# Patient Record
Sex: Female | Born: 1960 | Race: White | Hispanic: No | Marital: Married | Smoking: Former smoker
Health system: Southern US, Community
[De-identification: ages and names within clinical notes are randomized; demographics above are authoritative.]

## PROBLEM LIST (undated history)

## (undated) DIAGNOSIS — I10 Essential (primary) hypertension: Secondary | ICD-10-CM

## (undated) DIAGNOSIS — K219 Gastro-esophageal reflux disease without esophagitis: Secondary | ICD-10-CM

## (undated) DIAGNOSIS — G43909 Migraine, unspecified, not intractable, without status migrainosus: Secondary | ICD-10-CM

## (undated) HISTORY — PX: BREAST ENHANCEMENT SURGERY: SHX7

## (undated) HISTORY — PX: VAGINAL HYSTERECTOMY: SUR661

## (undated) HISTORY — PX: TONSILLECTOMY: SUR1361

## (undated) HISTORY — PX: ABDOMINAL HYSTERECTOMY: SHX81

## (undated) HISTORY — DX: Gastro-esophageal reflux disease without esophagitis: K21.9

## (undated) HISTORY — PX: ABDOMINAL SURGERY: SHX537

---

## 2019-02-10 ENCOUNTER — Other Ambulatory Visit: Payer: Self-pay

## 2019-02-10 ENCOUNTER — Encounter: Payer: Self-pay | Admitting: Family Medicine

## 2019-02-10 ENCOUNTER — Emergency Department
Admission: EM | Admit: 2019-02-10 | Discharge: 2019-02-10 | Disposition: A | Discharge status: Home / Self Care | Payer: Self-pay | Source: Home / Self Care

## 2019-02-10 DIAGNOSIS — R519 Headache, unspecified: Secondary | ICD-10-CM | POA: Diagnosis not present

## 2019-02-10 DIAGNOSIS — J0100 Acute maxillary sinusitis, unspecified: Secondary | ICD-10-CM | POA: Diagnosis not present

## 2019-02-10 HISTORY — DX: Essential (primary) hypertension: I10

## 2019-02-10 HISTORY — DX: Migraine, unspecified, not intractable, without status migrainosus: G43.909

## 2019-02-10 MED ORDER — PREDNISONE 50 MG PO TABS: ORAL_TABLET | ORAL | 0 refills | Status: DC

## 2019-02-10 MED ORDER — AMOXICILLIN 875 MG PO TABS: 875.0000 mg | ORAL_TABLET | Freq: Two times a day (BID) | ORAL | 0 refills | Status: DC

## 2019-02-10 NOTE — ED Provider Notes (Signed)
[name]    CSN: [id number] Arrival date & time: 02/10/19  1118      History   Chief Complaint Chief Complaint  Patient presents with  . Headache    HPI Nancy Bautista is a 58 y.o. female.   Initial [facility] visit for this 58 yo woman complaining of headache for a week.  Nothing is helping.  Has a hx of migraines.  Also having nasal drainage     Past Medical History:  Diagnosis Date  . Hypertension   . Migraines     There are no problems to display for this patient.     OB History   No obstetric history on file.      Home Medications    Prior to Admission medications   Medication Sig Start Date End Date Taking? Authorizing Provider  amoxicillin (AMOXIL) 875 MG tablet Take 1 tablet (875 mg total) by mouth 2 (two) times daily. 02/10/19   Robyn Haber, MD  baclofen (LIORESAL) 10 MG tablet PLEASE SEE ATTACHED FOR DETAILED DIRECTIONS 10/29/18   [provider]  metoprolol tartrate (LOPRESSOR) 25 MG tablet Take 12.5 mg by mouth 2 (two) times daily as needed. 09/01/18   [provider]  omeprazole (PRILOSEC) 40 MG capsule Take 40 mg by mouth daily. 12/01/18   [provider]  predniSONE (DELTASONE) 50 MG tablet One daily with food 02/10/19   Robyn Haber, MD    Family History Family History  Problem Relation Age of Onset  . Cancer Mother   . Heart failure Father     Social History Social History   Tobacco Use  . Smoking status: Never Smoker  . Smokeless tobacco: Never Used  Substance Use Topics  . Alcohol use: Not on file    Comment: socially  . Drug use: Not Currently     Allergies   Cardizem [diltiazem] and Ivp dye [iodinated diagnostic agents]   Review of Systems Review of Systems  HENT: Positive for congestion.   Neurological: Positive for headaches.  All other systems reviewed and are negative.    Physical Exam Triage Vital Signs ED Triage Vitals  Enc Vitals Group     BP      Pulse        Resp      Temp      Temp src      SpO2      Weight      Height      Head Circumference      Peak Flow      Pain Score      Pain Loc      Pain Edu?      Excl. in [place]?    No data found.  Updated Vital Signs BP (!) 166/95 (BP Location: Right Arm)   Pulse (!) 51   Temp 98.5 F (36.9 C) (Oral)   Resp 20   Ht 5\' 7"  (1.702 m)   Wt 73 kg   SpO2 98%   BMI 25.22 kg/m    Physical Exam Vitals reviewed.  Constitutional:      Appearance: She is well-developed.  HENT:     Head: Normocephalic.  Eyes:     Extraocular Movements: Extraocular movements intact.     Comments: Nasal swelling  Distorted left TM  Cardiovascular:     Rate and Rhythm: Bradycardia present.  Pulmonary:     Effort: Pulmonary effort is normal.  Musculoskeletal:        General: Normal range  of motion.     Cervical back: Normal range of motion and neck supple.  Skin:    General: Skin is warm and dry.  Neurological:     Mental Status: She is alert.  Psychiatric:        Mood and Affect: Mood normal.      UC Treatments / Results  Labs (all labs ordered are listed, but only abnormal results are displayed) Labs Reviewed  NOVEL CORONAVIRUS, NAA    EKG   Radiology No results found.  Procedures Procedures (including critical care time)  Medications Ordered in UC Medications - No data to display  Initial Impression / Assessment and Plan / UC Course  I have reviewed the triage vital signs and the nursing notes.  Pertinent labs & imaging results that were available during my care of the patient were reviewed by me and considered in my medical decision making (see chart for details).    Final Clinical Impressions(s) / UC Diagnoses   Final diagnoses:  Nonintractable headache, unspecified chronicity pattern, unspecified headache type  Acute non-recurrent maxillary sinusitis     Discharge Instructions     Your blood pressure is mildly elevated today and should be rechecked within the  month by your personal care provider.  Covid prevention measures:  Vitamin D3 5000 IU (125 mg) daily Vitamin C 500 mg twice daily Zinc 50 to 75 mg daily  Listerine type mouthwash 4 times a day       ED Prescriptions    Medication Sig Dispense Auth. Provider   amoxicillin (AMOXIL) 875 MG tablet Take 1 tablet (875 mg total) by mouth 2 (two) times daily. 20 tablet Elvina Sidle, MD   predniSONE (DELTASONE) 50 MG tablet One daily with food 2 tablet Elvina Sidle, MD     I have reviewed the PDMP during this encounter.   Elvina Sidle, MD 02/10/19 1201

## 2019-02-10 NOTE — Discharge Instructions (Addendum)
Your blood pressure is mildly elevated today and should be rechecked within the month by your personal care provider.  Covid prevention measures:  Vitamin D3 5000 IU (125 mg) daily Vitamin C 500 mg twice daily Zinc 50 to 75 mg daily  Listerine type mouthwash 4 times a day

## 2019-02-10 NOTE — ED Triage Notes (Signed)
Headache for a week.  Nothing is helping.  Has a hx of migraines.  Also having nasal drainage

## 2019-02-12 LAB — NOVEL CORONAVIRUS, NAA: SARS-CoV-2, NAA: NOT DETECTED

## 2019-05-11 ENCOUNTER — Other Ambulatory Visit: Payer: Self-pay

## 2019-05-11 ENCOUNTER — Encounter: Payer: Self-pay | Admitting: Medical-Surgical

## 2019-05-11 ENCOUNTER — Ambulatory Visit (INDEPENDENT_AMBULATORY_CARE_PROVIDER_SITE_OTHER): Payer: 59 | Admitting: Medical-Surgical

## 2019-05-11 VITALS — BP 150/90 | HR 62 | Temp 98.1°F | Ht 65.75 in | Wt 165.1 lb

## 2019-05-11 DIAGNOSIS — R011 Cardiac murmur, unspecified: Secondary | ICD-10-CM

## 2019-05-11 DIAGNOSIS — Z0000 Encounter for general adult medical examination without abnormal findings: Secondary | ICD-10-CM

## 2019-05-11 DIAGNOSIS — I10 Essential (primary) hypertension: Secondary | ICD-10-CM

## 2019-05-11 DIAGNOSIS — R002 Palpitations: Secondary | ICD-10-CM | POA: Diagnosis not present

## 2019-05-11 DIAGNOSIS — R32 Unspecified urinary incontinence: Secondary | ICD-10-CM

## 2019-05-11 DIAGNOSIS — R635 Abnormal weight gain: Secondary | ICD-10-CM

## 2019-05-11 MED ORDER — LOSARTAN POTASSIUM 25 MG PO TABS: 25.0000 mg | ORAL_TABLET | Freq: Every day | ORAL | 0 refills | Status: DC

## 2019-05-11 NOTE — Progress Notes (Signed)
.New Patient Office Visit  Subjective:  Patient ID: Nancy Bautista, female    DOB: 12/13/1960  Age: 59 y.o. MRN: [id number]  CC:  Chief Complaint  Patient presents with  . Establish Care  . Hypertension  . Weight Gain    HPI Nancy Bautista presents to establish care.  Palpitations-taking metoprolol 12.5 mg twice daily.  Feels this controls palpitations well with the occasional breakthrough episode.  Hypertension-Taking blood pressures at home with readings averaging 140s/80s-90s.  Adds minimal salt to foods, exercises regularly.  Denies history of snoring or sleep apnea.  4 to 5 glasses of wine per week.  Occasional sharp stabbing chest pains, attributed to costochondritis, relieved with lying on the flat surface and popping her back.  Denies shortness of breath, nausea, dizziness, and lower extremity swelling.  Weight gain-had a tummy tuck and breast lift done last year.  Traditionally very active and has resumed previous activity level.  Has a Systems analyst and exercises regularly.  Avoids processed foods, sugars, simple carbohydrates, sodas.  Drinks water most of the day.  Does drink 2 cups of keto coffee every morning.  Has gained approximately 15 pounds since her surgery despite all of this.  Reports previously trying intermittent fasting which did not help with weight loss.  Migraines with aura-reports having frequent headaches of all different types including sinus, tension, and ice pick.  Migraines occur approximately 3-4 times per year with visual aura and accompanied by photophobia/phonophobia.  Uses Tylenol severe sinus for regular headaches and takes baclofen for migraines  Has tried Topamax for prevention in the past but this had cognitive effects and she could not tolerate it.  Managed by neurology.  Urinary incontinence-evaluated by urology.  Mix of stress incontinence and urge incontinence.  Was told options for treatment include Kegels, pessary, or surgery.  Past  Medical History:  Diagnosis Date  . GERD (gastroesophageal reflux disease)   . Hypertension   . Migraines     Past Surgical History:  Procedure Laterality Date  . ABDOMINAL HYSTERECTOMY    . ABDOMINAL SURGERY    . BREAST ENHANCEMENT SURGERY      Family History  Problem Relation Age of Onset  . Cancer Mother   . Hypertension Mother   . Heart attack Mother   . Breast cancer Mother   . Ovarian cancer Mother   . Dementia Mother   . Stroke Mother   . Aneurysm Mother   . Heart failure Father   . Hypertension Father   . Heart attack Father   . Breast cancer Maternal Aunt   . Breast cancer Maternal Grandmother   . Ovarian cancer Maternal Grandmother     Social History   Socioeconomic History  . Marital status: Single    Spouse name: Not on file  . Number of children: Not on file  . Years of education: Not on file  . Highest education level: Not on file  Occupational History  . Not on file  Tobacco Use  . Smoking status: Former Games developer  . Smokeless tobacco: Never Used  Substance and Sexual Activity  . Alcohol use: Yes    Alcohol/week: 3.0 standard drinks    Types: 3 Glasses of wine per week    Comment: socially  . Drug use: Never  . Sexual activity: Yes    Birth control/protection: Surgical  Other Topics Concern  . Not on file  Social History Narrative  . Not on file   Social Determinants of Health   Financial Resource  Strain:   . Difficulty of Paying Living Expenses:   Food Insecurity:   . Worried About Programme researcher, broadcasting/film/video in the Last Year:   . Barista in the Last Year:   Transportation Needs:   . Freight forwarder (Medical):   Marland Kitchen Lack of Transportation (Non-Medical):   Physical Activity:   . Days of Exercise per Week:   . Minutes of Exercise per Session:   Stress:   . Feeling of Stress :   Social Connections:   . Frequency of Communication with Friends and Family:   . Frequency of Social Gatherings with Friends and Family:   . Attends  Religious Services:   . Active Member of Clubs or Organizations:   . Attends Banker Meetings:   Marland Kitchen Marital Status:   Intimate Partner Violence:   . Fear of Current or Ex-Partner:   . Emotionally Abused:   Marland Kitchen Physically Abused:   . Sexually Abused:     ROS Review of Systems  Respiratory: Negative for cough, chest tightness and shortness of breath.   Cardiovascular: Positive for chest pain (sharp chest pains, intermittent, resolved with laying on the ground and popping her back), palpitations (occasional) and leg swelling.  Gastrointestinal: Negative for abdominal pain, constipation, diarrhea and nausea.  Endocrine: Positive for cold intolerance and polydipsia.  Genitourinary: Positive for urgency.  Musculoskeletal: Positive for back pain and neck pain.  Skin: Negative for rash and wound.  Neurological: Positive for headaches. Negative for dizziness.  Psychiatric/Behavioral: Negative for self-injury, sleep disturbance and suicidal ideas.    Objective:   Today's Vitals: BP (!) 150/90   Pulse 62   Temp 98.1 F (36.7 C) (Oral)   Ht 5' 5.75" (1.67 m)   Wt 165 lb 1.6 oz (74.9 kg)   SpO2 95%   BMI 26.85 kg/m   Physical Exam Vitals reviewed.  Constitutional:      General: She is not in acute distress.    Appearance: Normal appearance.  HENT:     Head: Normocephalic and atraumatic.     Right Ear: Tympanic membrane normal.     Left Ear: Tympanic membrane normal.  Eyes:     Extraocular Movements: Extraocular movements intact.     Conjunctiva/sclera: Conjunctivae normal.     Pupils: Pupils are equal, round, and reactive to light.  Cardiovascular:     Rate and Rhythm: Normal rate and regular rhythm.     Pulses: Normal pulses.     Heart sounds: Murmur (Systolic, grade 3/6) present. No friction rub. No gallop.   Pulmonary:     Effort: Pulmonary effort is normal. No respiratory distress.     Breath sounds: Normal breath sounds. No wheezing.  Abdominal:     General:  Abdomen is flat. Bowel sounds are normal. There is no distension.     Palpations: Abdomen is soft. There is no mass.     Tenderness: There is no abdominal tenderness. There is no right CVA tenderness, left CVA tenderness, guarding or rebound.     Hernia: No hernia is present.  Musculoskeletal:     Cervical back: Normal range of motion and neck supple.  Skin:    General: Skin is warm and dry.  Neurological:     Mental Status: She is alert and oriented to person, place, and time.  Psychiatric:        Mood and Affect: Mood normal.        Behavior: Behavior normal.  Thought Content: Thought content normal.        Judgment: Judgment normal.     Assessment & Plan:   1. Essential hypertension Starting losartan 25 mg daily.  Continue checking blood pressures at home and avoiding processed/fast foods.  Limit sodium to less than 2 g/day.  Continue regular exercise. - losartan (COZAAR) 25 MG tablet; Take 1 tablet (25 mg total) by mouth daily.  Dispense: 90 tablet; Refill: 0  2. Preventative health care Checking CBC, CMP, and lipid panel today.  Up-to-date on preventative care. - Comprehensive metabolic panel - Lipid panel - CBC  3. Palpitations Continue metoprolol 12.5 mg twice daily. - Ambulatory referral to Cardiology  4. Heart murmur, systolic Patient reports previously being told that she had a heart murmur but that this resolved on its own.  Referring to cardiology to establish relationship here at patient's request for further evaluation of heart murmur. - Ambulatory referral to Cardiology  5.  Urinary incontinence Recommend continuing Kegels.  Managed by urology.  6.  Weight gain Discussed options available to help with weight loss.  With history of palpitations and hypertension, would like to avoid phentermine.  Previously did not tolerate Topamax so would also avoid that.  Interested in [place] or [place].  Will get better control of blood pressure and then discuss  starting weight loss medications.  Outpatient Encounter Medications as of 05/11/2019  Medication Sig  . baclofen (LIORESAL) 10 MG tablet PLEASE SEE ATTACHED FOR DETAILED DIRECTIONS  . metoprolol tartrate (LOPRESSOR) 25 MG tablet Take 12.5 mg by mouth 2 (two) times daily as needed.  Marland Kitchen omeprazole (PRILOSEC) 40 MG capsule Take 40 mg by mouth daily.  Marland Kitchen UNABLE TO FIND daily. Bio Identical Hormones  . VITAMIN D PO Take 2,000 Int'l Units by mouth daily.  Marland Kitchen losartan (COZAAR) 25 MG tablet Take 1 tablet (25 mg total) by mouth daily.  . [DISCONTINUED] amoxicillin (AMOXIL) 875 MG tablet Take 1 tablet (875 mg total) by mouth 2 (two) times daily. (Patient not taking: Reported on 05/11/2019)  . [DISCONTINUED] predniSONE (DELTASONE) 50 MG tablet One daily with food (Patient not taking: Reported on 05/11/2019)   No facility-administered encounter medications on file as of 05/11/2019.    Follow-up: Return in 4 weeks (on 06/08/2019) for HTN follow up.   Clearnce Sorrel, DNP, APRN, FNP-BC [facility]

## 2019-05-12 LAB — CBC
HCT: 41.6 % (ref 35.0–45.0)
Hemoglobin: 13.8 g/dL (ref 11.7–15.5)
MCH: 30.8 pg (ref 27.0–33.0)
MCHC: 33.2 g/dL (ref 32.0–36.0)
MCV: 92.9 fL (ref 80.0–100.0)
MPV: 10.5 fL (ref 7.5–12.5)
Platelets: 302 10*3/uL (ref 140–400)
RBC: 4.48 10*6/uL (ref 3.80–5.10)
RDW: 12.8 % (ref 11.0–15.0)
WBC: 7.7 10*3/uL (ref 3.8–10.8)

## 2019-05-12 LAB — COMPREHENSIVE METABOLIC PANEL
AG Ratio: 1.6 (calc) (ref 1.0–2.5)
ALT: 25 U/L (ref 6–29)
AST: 21 U/L (ref 10–35)
Albumin: 4.1 g/dL (ref 3.6–5.1)
Alkaline phosphatase (APISO): 40 U/L (ref 37–153)
BUN: 15 mg/dL (ref 7–25)
CO2: 26 mmol/L (ref 20–32)
Calcium: 9.4 mg/dL (ref 8.6–10.4)
Chloride: 105 mmol/L (ref 98–110)
Creat: 0.92 mg/dL (ref 0.50–1.05)
Globulin: 2.5 g/dL (calc) (ref 1.9–3.7)
Glucose, Bld: 102 mg/dL — ABNORMAL HIGH (ref 65–99)
Potassium: 4.3 mmol/L (ref 3.5–5.3)
Sodium: 140 mmol/L (ref 135–146)
Total Bilirubin: 0.3 mg/dL (ref 0.2–1.2)
Total Protein: 6.6 g/dL (ref 6.1–8.1)

## 2019-05-12 LAB — LIPID PANEL
Cholesterol: 195 mg/dL (ref <200)
HDL: 73 mg/dL (ref >=50)
LDL Cholesterol (Calc): 107 mg/dL (calc) — ABNORMAL HIGH
Non-HDL Cholesterol (Calc): 122 mg/dL (calc) (ref <130)
Total CHOL/HDL Ratio: 2.7 (calc) (ref <5.0)
Triglycerides: 60 mg/dL (ref <150)

## 2019-06-01 ENCOUNTER — Other Ambulatory Visit: Payer: Self-pay

## 2019-06-01 ENCOUNTER — Encounter: Payer: Self-pay | Admitting: Medical-Surgical

## 2019-06-01 ENCOUNTER — Ambulatory Visit (INDEPENDENT_AMBULATORY_CARE_PROVIDER_SITE_OTHER): Payer: 59 | Admitting: Medical-Surgical

## 2019-06-01 VITALS — BP 110/72 | HR 53 | Temp 97.9°F | Ht 65.75 in | Wt 165.0 lb

## 2019-06-01 DIAGNOSIS — R635 Abnormal weight gain: Secondary | ICD-10-CM

## 2019-06-01 DIAGNOSIS — I10 Essential (primary) hypertension: Secondary | ICD-10-CM

## 2019-06-01 DIAGNOSIS — F418 Other specified anxiety disorders: Secondary | ICD-10-CM

## 2019-06-01 MED ORDER — NALTREXONE-BUPROPION HCL ER 8-90 MG PO TB12: ORAL_TABLET | ORAL | 0 refills | Status: DC

## 2019-06-01 MED ORDER — BUPROPION HCL ER (XL) 150 MG PO TB24: 150.0000 mg | ORAL_TABLET | ORAL | 0 refills | Status: DC

## 2019-06-01 NOTE — Addendum Note (Signed)
Addended byChristen Butter on: 06/01/2019 02:26 PM   Modules accepted: Orders

## 2019-06-01 NOTE — Telephone Encounter (Signed)
Routing to provider  

## 2019-06-01 NOTE — Progress Notes (Addendum)
Subjective:    CC: HTN follow up  HPI: Pleasant 59 year old female presenting today for hypertension follow-up.  Started losartan 25 mg daily approximately 1 month ago.  Tolerating the medication well without side effects.  Checking blood pressures intermittently at home.  Denies chest pain, shortness of breath, lower extremity edema, headaches, dizziness.  Continues to have palpitations but these are no worse than usual and are managed with metoprolol 12.5 mg twice daily as needed.  Remains very concerned about her weight.  She is exercising regularly with a trainer, working with a nutritionist on her diet, and regularly tracking her progress.  Despite all this she has been unable to lose any weight at all.  Reports that her clothes are not fitting her any better after 2 months of strict diet and exercise.  Plans to get married in September and is very concerned about her weight.  Reports she started experiencing some depression and anxiety revolving around the weight issue.  Reports her goal weight is approximately 150 pounds.  Interested in trying medication to help with weight loss.  I reviewed the past medical history, family history, social history, surgical history, and allergies today and no changes were needed.  Please see the problem list section below in epic for further details.  Past Medical History: Past Medical History:  Diagnosis Date  . GERD (gastroesophageal reflux disease)   . Hypertension   . Migraines    Past Surgical History: Past Surgical History:  Procedure Laterality Date  . ABDOMINAL HYSTERECTOMY    . ABDOMINAL SURGERY    . BREAST ENHANCEMENT SURGERY     Social History: Social History   Socioeconomic History  . Marital status: Single    Spouse name: Not on file  . Number of children: Not on file  . Years of education: Not on file  . Highest education level: Not on file  Occupational History  . Not on file  Tobacco Use  . Smoking status: Former Research scientist (life sciences)   . Smokeless tobacco: Never Used  Substance and Sexual Activity  . Alcohol use: Yes    Alcohol/week: 3.0 standard drinks    Types: 3 Glasses of wine per week    Comment: socially  . Drug use: Never  . Sexual activity: Yes    Birth control/protection: Surgical  Other Topics Concern  . Not on file  Social History Narrative  . Not on file   Social Determinants of Health   Financial Resource Strain:   . Difficulty of Paying Living Expenses:   Food Insecurity:   . Worried About Charity fundraiser in the Last Year:   . Arboriculturist in the Last Year:   Transportation Needs:   . Film/video editor (Medical):   Marland Kitchen Lack of Transportation (Non-Medical):   Physical Activity:   . Days of Exercise per Week:   . Minutes of Exercise per Session:   Stress:   . Feeling of Stress :   Social Connections:   . Frequency of Communication with Friends and Family:   . Frequency of Social Gatherings with Friends and Family:   . Attends Religious Services:   . Active Member of Clubs or Organizations:   . Attends Archivist Meetings:   Marland Kitchen Marital Status:    Family History: Family History  Problem Relation Age of Onset  . Cancer Mother   . Hypertension Mother   . Heart attack Mother   . Breast cancer Mother   . Ovarian cancer Mother   .  Dementia Mother   . Stroke Mother   . Aneurysm Mother   . Heart failure Father   . Hypertension Father   . Heart attack Father   . Breast cancer Maternal Aunt   . Breast cancer Maternal Grandmother   . Ovarian cancer Maternal Grandmother    Allergies: Allergies  Allergen Reactions  . Iodine Swelling    Lips swelling Lips swelling   . Cardizem [Diltiazem]   . Ivp Dye [Iodinated Diagnostic Agents]    Medications: See med rec.  Review of Systems: No fevers, chills, night sweats, weight loss, chest pain, or shortness of breath.   Objective:    General: Well Developed, well nourished, and in no acute distress.  Neuro: Alert and  oriented x3.  HEENT: Normocephalic, atraumatic.  Skin: Warm and dry. Cardiac: Regular rate and rhythm, no murmurs rubs or gallops, no lower extremity edema.  Respiratory: Clear to auscultation bilaterally. Not using accessory muscles, speaking in full sentences.   Impression and Recommendations:    1. Essential hypertension Checking renal function and electrolytes today.  Continue losartan 25 mg daily. - COMPLETE METABOLIC PANEL WITH GFR  2. Weight gain Discussed various options for weight loss.  Patient is very interested in the injections but unfortunately I do not think we can get this covered with her insurance.  Unfortunately, Contrave is also not covered by her insurance. Sending in Wellbutrin 150mg  daily.   Return in about 6 weeks (around 07/13/2019) for weight check and mood follow up. ___________________________________________ 07/15/2019, DNP, APRN, FNP-BC [facility] [place]

## 2019-06-02 ENCOUNTER — Other Ambulatory Visit: Payer: Self-pay | Admitting: Medical-Surgical

## 2019-06-02 DIAGNOSIS — R7989 Other specified abnormal findings of blood chemistry: Secondary | ICD-10-CM

## 2019-06-02 DIAGNOSIS — N289 Disorder of kidney and ureter, unspecified: Secondary | ICD-10-CM

## 2019-06-02 LAB — COMPLETE METABOLIC PANEL WITH GFR
AG Ratio: 1.7 (calc) (ref 1.0–2.5)
ALT: 23 U/L (ref 6–29)
AST: 18 U/L (ref 10–35)
Albumin: 4.1 g/dL (ref 3.6–5.1)
Alkaline phosphatase (APISO): 50 U/L (ref 37–153)
BUN/Creatinine Ratio: 13 (calc) (ref 6–22)
BUN: 16 mg/dL (ref 7–25)
CO2: 25 mmol/L (ref 20–32)
Calcium: 9.6 mg/dL (ref 8.6–10.4)
Chloride: 105 mmol/L (ref 98–110)
Creat: 1.2 mg/dL — ABNORMAL HIGH (ref 0.50–1.05)
GFR, Est African American: 58 mL/min/{1.73_m2} — ABNORMAL LOW (ref >=60)
GFR, Est Non African American: 50 mL/min/{1.73_m2} — ABNORMAL LOW (ref >=60)
Globulin: 2.4 g/dL (calc) (ref 1.9–3.7)
Glucose, Bld: 97 mg/dL (ref 65–99)
Potassium: 4.6 mmol/L (ref 3.5–5.3)
Sodium: 139 mmol/L (ref 135–146)
Total Bilirubin: 0.4 mg/dL (ref 0.2–1.2)
Total Protein: 6.5 g/dL (ref 6.1–8.1)

## 2019-06-02 NOTE — Progress Notes (Signed)
Pt has seen results on MyChart and she sent a message back that she will get the repeat labs in 2 weeks. Lab orders placed.

## 2019-06-16 LAB — COMPLETE METABOLIC PANEL WITH GFR
AG Ratio: 1.7 (calc) (ref 1.0–2.5)
ALT: 23 U/L (ref 6–29)
AST: 18 U/L (ref 10–35)
Albumin: 4.3 g/dL (ref 3.6–5.1)
Alkaline phosphatase (APISO): 51 U/L (ref 37–153)
BUN/Creatinine Ratio: 16 (calc) (ref 6–22)
BUN: 17 mg/dL (ref 7–25)
CO2: 26 mmol/L (ref 20–32)
Calcium: 9.8 mg/dL (ref 8.6–10.4)
Chloride: 105 mmol/L (ref 98–110)
Creat: 1.07 mg/dL — ABNORMAL HIGH (ref 0.50–1.05)
GFR, Est African American: 67 mL/min/{1.73_m2} (ref >=60)
GFR, Est Non African American: 58 mL/min/{1.73_m2} — ABNORMAL LOW (ref >=60)
Globulin: 2.6 g/dL (calc) (ref 1.9–3.7)
Glucose, Bld: 105 mg/dL (ref 65–139)
Potassium: 4.6 mmol/L (ref 3.5–5.3)
Sodium: 139 mmol/L (ref 135–146)
Total Bilirubin: 0.4 mg/dL (ref 0.2–1.2)
Total Protein: 6.9 g/dL (ref 6.1–8.1)

## 2019-07-06 NOTE — Progress Notes (Signed)
Referring-Nancy Jessup NP Reason for referral-palpitations and murmur  HPI: 59 year old female for evaluation of palpitations and murmur at request of Samuel Bouche NP.  Stress echocardiogram 2017 normal. Calcium score 2017 19. Laboratories December 2020 showed normal TSH. Laboratories from May 2021 showed sodium 139, potassium 4.6, BUN 17 and creatinine 1.07.  Patient states she has a history of mitral valve prolapse.  She has had intermittent palpitations for years.  There is some improvement with metoprolol.  They are described as a skip.  She denies dyspnea on exertion, orthopnea, PND or pedal edema.  She does not have exertional chest pain though she can have a sharp pain in various locations on her chest at times that improves with holding her chest.  She was recently felt to have a possible murmur.  Cardiology asked to evaluate.  Current Outpatient Medications  Medication Sig Dispense Refill  . baclofen (LIORESAL) 10 MG tablet PLEASE SEE ATTACHED FOR DETAILED DIRECTIONS    . buPROPion (WELLBUTRIN XL) 150 MG 24 hr tablet Take 1 tablet (150 mg total) by mouth every morning. 90 tablet 0  . losartan (COZAAR) 25 MG tablet Take 1 tablet (25 mg total) by mouth daily. 90 tablet 0  . metoprolol tartrate (LOPRESSOR) 25 MG tablet Take 12.5 mg by mouth 2 (two) times daily as needed.    Marland Kitchen omeprazole (PRILOSEC) 40 MG capsule Take 40 mg by mouth daily.    Marland Kitchen UNABLE TO FIND daily. Bio Identical Hormones    . VITAMIN D PO Take 2,000 Int'l Units by mouth daily.     No current facility-administered medications for this visit.    Allergies  Allergen Reactions  . Iodine Swelling    Lips swelling Lips swelling   . Cardizem [Diltiazem]   . Ivp Dye [Iodinated Diagnostic Agents]      Past Medical History:  Diagnosis Date  . GERD (gastroesophageal reflux disease)   . Hypertension   . Migraines     Past Surgical History:  Procedure Laterality Date  . ABDOMINAL HYSTERECTOMY    . ABDOMINAL SURGERY     . BREAST ENHANCEMENT SURGERY    . TONSILLECTOMY      Social History   Socioeconomic History  . Marital status: Single    Spouse name: Not on file  . Number of children: 3  . Years of education: Not on file  . Highest education level: Not on file  Occupational History  . Not on file  Tobacco Use  . Smoking status: Former Research scientist (life sciences)  . Smokeless tobacco: Never Used  Substance and Sexual Activity  . Alcohol use: Yes    Alcohol/week: 3.0 standard drinks    Types: 3 Glasses of wine per week    Comment: socially  . Drug use: Never  . Sexual activity: Yes    Birth control/protection: Surgical  Other Topics Concern  . Not on file  Social History Narrative  . Not on file   Social Determinants of Health   Financial Resource Strain:   . Difficulty of Paying Living Expenses:   Food Insecurity:   . Worried About Charity fundraiser in the Last Year:   . Arboriculturist in the Last Year:   Transportation Needs:   . Film/video editor (Medical):   Marland Kitchen Lack of Transportation (Non-Medical):   Physical Activity:   . Days of Exercise per Week:   . Minutes of Exercise per Session:   Stress:   . Feeling of Stress :  Social Connections:   . Frequency of Communication with Friends and Family:   . Frequency of Social Gatherings with Friends and Family:   . Attends Religious Services:   . Active Member of Clubs or Organizations:   . Attends Banker Meetings:   Marland Kitchen Marital Status:   Intimate Partner Violence:   . Fear of Current or Ex-Partner:   . Emotionally Abused:   Marland Kitchen Physically Abused:   . Sexually Abused:     Family History  Problem Relation Age of Onset  . Cancer Mother   . Hypertension Mother   . Heart attack Mother   . Breast cancer Mother   . Ovarian cancer Mother   . Dementia Mother   . Stroke Mother   . Aneurysm Mother   . Heart failure Father   . Hypertension Father   . Heart attack Father   . Breast cancer Maternal Aunt   . Breast cancer  Maternal Grandmother   . Ovarian cancer Maternal Grandmother     ROS: no fevers or chills, productive cough, hemoptysis, dysphasia, odynophagia, melena, hematochezia, dysuria, hematuria, rash, seizure activity, orthopnea, PND, pedal edema, claudication. Remaining systems are negative.  Physical Exam:   Blood pressure 139/87, pulse 65, height 5\' 6"  (1.676 m), weight 161 lb 12.8 oz (73.4 kg).  General:  Well developed/well nourished in NAD Skin warm/dry Patient not depressed No peripheral clubbing Back-normal HEENT-normal/normal eyelids Neck supple/normal carotid upstroke bilaterally; no bruits; no JVD; no thyromegaly chest - CTA/ normal expansion CV - RRR/normal S1 and S2; no murmurs, rubs or gallops;  PMI nondisplaced Abdomen -NT/ND, no HSM, no mass, + bowel sounds, no bruit 2+ femoral pulses, no bruits Ext-no edema, chords, 2+ DP Neuro-grossly nonfocal  ECG -sinus rhythm at a rate of 65, no ST changes.  Personally reviewed  A/P  1 hypertension-patient's blood pressure is mildly elevated but she states losartan was recently added and it is typically controlled.  Continue present medications and follow.  2 palpitations-continue metoprolol at present dose.  3 murmur-not evident on examination.  She does have a history of mitral valve prolapse and we will plan to repeat echocardiogram to reassess mitral valve morphology and to rule out mitral regurgitation.  , MD

## 2019-07-08 ENCOUNTER — Ambulatory Visit (INDEPENDENT_AMBULATORY_CARE_PROVIDER_SITE_OTHER): Payer: 59 | Admitting: Cardiology

## 2019-07-08 ENCOUNTER — Other Ambulatory Visit: Payer: Self-pay

## 2019-07-08 ENCOUNTER — Encounter: Payer: Self-pay | Admitting: Cardiology

## 2019-07-08 VITALS — BP 139/87 | HR 65 | Ht 66.0 in | Wt 161.8 lb

## 2019-07-08 DIAGNOSIS — R002 Palpitations: Secondary | ICD-10-CM | POA: Diagnosis not present

## 2019-07-08 DIAGNOSIS — I10 Essential (primary) hypertension: Secondary | ICD-10-CM

## 2019-07-08 DIAGNOSIS — R011 Cardiac murmur, unspecified: Secondary | ICD-10-CM | POA: Diagnosis not present

## 2019-07-08 NOTE — Patient Instructions (Signed)
Medication Instructions:  NO CHANGE *If you need a refill on your cardiac medications before your next appointment, please call your pharmacy*   Lab Work: If you have labs (blood work) drawn today and your tests are completely normal, you will receive your results only by: . MyChart Message (if you have MyChart) OR . A paper copy in the mail If you have any lab test that is abnormal or we need to change your treatment, we will call you to review the results.   Testing/Procedures:  Your physician has requested that you have an echocardiogram. Echocardiography is a painless test that uses sound waves to create images of your heart. It provides your doctor with information about the size and shape of your heart and how well your heart's chambers and valves are working. This procedure takes approximately one hour. There are no restrictions for this procedure.[facility]    Follow-Up: At [facility], you and your health needs are our priority.  As part of our continuing mission to provide you with exceptional heart care, we have created designated Provider Care Teams.  These Care Teams include your primary Cardiologist (physician) and Advanced Practice Providers (APPs -  Physician Assistants and Nurse Practitioners) who all work together to provide you with the care you need, when you need it.  We recommend signing up for the patient portal called "MyChart".  Sign up information is provided on this After Visit Summary.  MyChart is used to connect with patients for Virtual Visits (Telemedicine).  Patients are able to view lab/test results, encounter notes, upcoming appointments, etc.  Non-urgent messages can be sent to your provider as well.   To learn more about what you can do with MyChart, go to [email or web address].    Your next appointment:   12 month(s)  The format for your next appointment:   Either In Person or Virtual  Provider:   Brian Crenshaw, MD    

## 2019-07-13 NOTE — Progress Notes (Signed)
Subjective:    CC: weight check  HPI: Pleasant 59 year old female presenting today for a weight check since starting Wellbutrin XL 150mg  daily 6 weeks ago. Tolerating the medication well without side effects. Feels the Wellbutrin has helped quite a bit with her anxiety/depression and worry over things as well as reduced her cravings.   Reports she has noticed an increase in hair loss and eyelash loss. She suspects this is related to the micronized testosterone and bio-identical hormones she has been taking. She reports she plans to stop taking these and is interested in other options to treat/prevent vaginal atrophy.  I reviewed the past medical history, family history, social history, surgical history, and allergies today and no changes were needed.  Please see the problem list section below in epic for further details.  Past Medical History: Past Medical History:  Diagnosis Date  . GERD (gastroesophageal reflux disease)   . Hypertension   . Migraines    Past Surgical History: Past Surgical History:  Procedure Laterality Date  . ABDOMINAL HYSTERECTOMY    . ABDOMINAL SURGERY    . BREAST ENHANCEMENT SURGERY    . TONSILLECTOMY     Social History: Social History   Socioeconomic History  . Marital status: Single    Spouse name: Not on file  . Number of children: 3  . Years of education: Not on file  . Highest education level: Not on file  Occupational History  . Not on file  Tobacco Use  . Smoking status: Former Research scientist (life sciences)  . Smokeless tobacco: Never Used  Substance and Sexual Activity  . Alcohol use: Yes    Alcohol/week: 3.0 standard drinks    Types: 3 Glasses of wine per week    Comment: socially  . Drug use: Never  . Sexual activity: Yes    Birth control/protection: Surgical  Other Topics Concern  . Not on file  Social History Narrative  . Not on file   Social Determinants of Health   Financial Resource Strain:   . Difficulty of Paying Living Expenses:   Food  Insecurity:   . Worried About Charity fundraiser in the Last Year:   . Arboriculturist in the Last Year:   Transportation Needs:   . Film/video editor (Medical):   Marland Kitchen Lack of Transportation (Non-Medical):   Physical Activity:   . Days of Exercise per Week:   . Minutes of Exercise per Session:   Stress:   . Feeling of Stress :   Social Connections:   . Frequency of Communication with Friends and Family:   . Frequency of Social Gatherings with Friends and Family:   . Attends Religious Services:   . Active Member of Clubs or Organizations:   . Attends Archivist Meetings:   Marland Kitchen Marital Status:    Family History: Family History  Problem Relation Age of Onset  . Cancer Mother   . Hypertension Mother   . Heart attack Mother   . Breast cancer Mother   . Ovarian cancer Mother   . Dementia Mother   . Stroke Mother   . Aneurysm Mother   . Heart failure Father   . Hypertension Father   . Heart attack Father   . Breast cancer Maternal Aunt   . Breast cancer Maternal Grandmother   . Ovarian cancer Maternal Grandmother    Allergies: Allergies  Allergen Reactions  . Iodine Swelling    Lips swelling Lips swelling   . Cardizem [Diltiazem]   .  Ivp Dye [Iodinated Diagnostic Agents]    Medications: See med rec.  Review of Systems: See HPI for pertinent positives and negatives.   Objective:    General: Well Developed, well nourished, and in no acute distress.  Neuro: Alert and oriented x3.  HEENT: Normocephalic, atraumatic.  Skin: Warm and dry. Cardiac: Regular rate and rhythm, no murmurs rubs or gallops, no lower extremity edema.  Respiratory: Clear to auscultation bilaterally. Not using accessory muscles, speaking in full sentences.   Impression and Recommendations:    1. Weight gain 6lb weight loss in 6 weeks. Continue Wellbutrin XL 150mg  daily. - buPROPion (WELLBUTRIN XL) 150 MG 24 hr tablet; Take 1 tablet (150 mg total) by mouth every morning.  Dispense:  90 tablet; Refill: 1  2. Depression with anxiety Continue Wellbutrin XL 150mg  daily. - buPROPion (WELLBUTRIN XL) 150 MG 24 hr tablet; Take 1 tablet (150 mg total) by mouth every morning.  Dispense: 90 tablet; Refill: 1  3. Vaginal atrophy Discussed various options including tablets and creams. Will look into further options and touch base with patient for her information and research.  4. Need for hepatitis C screening test Discussed screening recommendations. Pt agreeable and will have drawn with next lab draws. - Hepatitis C antibody  5. Screening for HIV (human immunodeficiency virus) Discussed screening recommendations. Pt agreeable and will have drawn with next lab draws. - HIV Antibody (routine testing w rflx)  Return in about 8 weeks (around 09/08/2019) for weight check. ___________________________________________ , DNP, APRN, FNP-BC Primary Care and Sports Medicine [facility]

## 2019-07-14 ENCOUNTER — Ambulatory Visit (INDEPENDENT_AMBULATORY_CARE_PROVIDER_SITE_OTHER): Payer: 59 | Admitting: Medical-Surgical

## 2019-07-14 ENCOUNTER — Encounter: Payer: Self-pay | Admitting: Medical-Surgical

## 2019-07-14 ENCOUNTER — Other Ambulatory Visit: Payer: Self-pay

## 2019-07-14 VITALS — BP 137/84 | HR 51 | Temp 98.2°F | Ht 66.0 in | Wt 159.1 lb

## 2019-07-14 DIAGNOSIS — Z1159 Encounter for screening for other viral diseases: Secondary | ICD-10-CM

## 2019-07-14 DIAGNOSIS — R635 Abnormal weight gain: Secondary | ICD-10-CM

## 2019-07-14 DIAGNOSIS — N952 Postmenopausal atrophic vaginitis: Secondary | ICD-10-CM

## 2019-07-14 DIAGNOSIS — Z114 Encounter for screening for human immunodeficiency virus [HIV]: Secondary | ICD-10-CM

## 2019-07-14 DIAGNOSIS — F418 Other specified anxiety disorders: Secondary | ICD-10-CM | POA: Diagnosis not present

## 2019-07-14 MED ORDER — BUPROPION HCL ER (XL) 150 MG PO TB24: 150.0000 mg | ORAL_TABLET | ORAL | 1 refills | Status: DC

## 2019-07-15 MED ORDER — IMVEXXY MAINTENANCE PACK 4 MCG VA INST: VAGINAL_INSERT | VAGINAL | 0 refills | Status: DC

## 2019-07-15 NOTE — Addendum Note (Signed)
Addended byChristen Butter on: 07/15/2019 12:02 PM   Modules accepted: Orders

## 2019-08-02 ENCOUNTER — Other Ambulatory Visit: Payer: Self-pay | Admitting: Medical-Surgical

## 2019-08-02 DIAGNOSIS — I10 Essential (primary) hypertension: Secondary | ICD-10-CM

## 2019-08-03 ENCOUNTER — Other Ambulatory Visit (HOSPITAL_BASED_OUTPATIENT_CLINIC_OR_DEPARTMENT_OTHER): Payer: 59

## 2019-08-05 ENCOUNTER — Other Ambulatory Visit: Payer: Self-pay | Admitting: Medical-Surgical

## 2019-08-05 ENCOUNTER — Other Ambulatory Visit: Payer: Self-pay

## 2019-08-05 ENCOUNTER — Ambulatory Visit (HOSPITAL_BASED_OUTPATIENT_CLINIC_OR_DEPARTMENT_OTHER)
Admission: RE | Admit: 2019-08-05 | Discharge: 2019-08-05 | Disposition: A | Discharge status: Home / Self Care | Payer: 59 | Source: Ambulatory Visit | Attending: Cardiology | Admitting: Cardiology

## 2019-08-05 DIAGNOSIS — N952 Postmenopausal atrophic vaginitis: Secondary | ICD-10-CM

## 2019-08-05 DIAGNOSIS — R011 Cardiac murmur, unspecified: Secondary | ICD-10-CM | POA: Diagnosis present

## 2019-09-07 ENCOUNTER — Encounter: Payer: Self-pay | Admitting: Medical-Surgical

## 2019-09-08 ENCOUNTER — Ambulatory Visit: Payer: 59 | Admitting: Medical-Surgical

## 2019-09-09 ENCOUNTER — Ambulatory Visit (INDEPENDENT_AMBULATORY_CARE_PROVIDER_SITE_OTHER): Payer: 59 | Admitting: Medical-Surgical

## 2019-09-09 ENCOUNTER — Encounter: Payer: Self-pay | Admitting: Medical-Surgical

## 2019-09-09 ENCOUNTER — Other Ambulatory Visit: Payer: Self-pay

## 2019-09-09 VITALS — BP 143/86 | HR 56 | Temp 97.6°F | Ht 66.0 in | Wt 157.8 lb

## 2019-09-09 DIAGNOSIS — Z1159 Encounter for screening for other viral diseases: Secondary | ICD-10-CM | POA: Diagnosis not present

## 2019-09-09 DIAGNOSIS — F40243 Fear of flying: Secondary | ICD-10-CM | POA: Diagnosis not present

## 2019-09-09 DIAGNOSIS — I10 Essential (primary) hypertension: Secondary | ICD-10-CM

## 2019-09-09 DIAGNOSIS — N952 Postmenopausal atrophic vaginitis: Secondary | ICD-10-CM

## 2019-09-09 DIAGNOSIS — R32 Unspecified urinary incontinence: Secondary | ICD-10-CM | POA: Diagnosis not present

## 2019-09-09 DIAGNOSIS — M25552 Pain in left hip: Secondary | ICD-10-CM

## 2019-09-09 DIAGNOSIS — R635 Abnormal weight gain: Secondary | ICD-10-CM

## 2019-09-09 MED ORDER — ALPRAZOLAM 0.25 MG PO TABS: 0.2500 mg | ORAL_TABLET | Freq: Every day | ORAL | 0 refills | Status: AC | PRN

## 2019-09-09 NOTE — Progress Notes (Signed)
Subjective:    CC: weight check  HPI: Pleasant 59 year old female presenting for weight check on Wellbutrin today. She also would like to discuss several other concerns.  Weight check: Continues to take Wellbutrin 150 mg daily.  Tolerating medication well without side effects.  Reports this medication has helped her tremendously with cravings and she feels great while taking it.  Continues to exercise and watch her diet.  Essential hypertension: Taking losartan 25 mg daily and metoprolol 12.5 mg twice daily.  Checking blood pressures at home with readings 130s/80s.  Blood pressure is elevated at 143/86 here today. No added salt to diet and is currently exercising regularly for weight loss.  Denies shortness of breath, palpitations, lower extremity edema, headaches, and dizziness.  Continues to have intermittent chest pain twinges but these have been worked up by cardiology and they were found to be benign.  Left hip pain: Reports increased left hip pain with her increased exercise level.  Has had this evaluated and notes that there is some osteoarthritis present in the joint.  Does not take anti-inflammatories as she worries about the effect on her blood pressure.  Urinary incontinence: History of urinary incontinence predominantly stress incontinence.  She has been doing Kegel's regularly but is not seeing an improvement.  Notes that her urinary incontinence has gotten worse lately and now she leaks urine with simple activities such as a walking on the treadmill.  Is having to wear pads most of the time.  Was seen by urogynecologist in [place] a while back but did not have a pleasant experience there.  Would like a new referral to a urogynecologist for further evaluation and potential treatment.  Vaginal atrophy: Using Imvexxy after stopping bioidentical hormones.  Is not 100% happy with the results with Imvexxy but she is only a month or 2 into it.  She is willing to continue trying the  medication to see if she notes improvement with time.  Continues to have some vaginal dryness and low libido.  Anxiety with flying: Several upcoming trips requiring flights.  Significant anxiety with flying previously treated with Xanax 0.125 mg at the start of the flight.  She does still have a few tabs at home but these are approximately 57 years old.  Requesting a few tabs to get her through her upcoming trips.  Will be visiting family in [place] and [place].  Getting married in September and will be going to the [place] for her honeymoon.  I reviewed the past medical history, family history, social history, surgical history, and allergies today and no changes were needed.  Please see the problem list section below in epic for further details.  Past Medical History: Past Medical History:  Diagnosis Date  . GERD (gastroesophageal reflux disease)   . Hypertension   . Migraines    Past Surgical History: Past Surgical History:  Procedure Laterality Date  . ABDOMINAL HYSTERECTOMY    . ABDOMINAL SURGERY    . BREAST ENHANCEMENT SURGERY    . TONSILLECTOMY     Social History: Social History   Socioeconomic History  . Marital status: Single    Spouse name: Not on file  . Number of children: 3  . Years of education: Not on file  . Highest education level: Not on file  Occupational History  . Not on file  Tobacco Use  . Smoking status: Former Games developer  . Smokeless tobacco: Never Used  Vaping Use  . Vaping Use: Never used  Substance and Sexual Activity  .  Alcohol use: Yes    Alcohol/week: 3.0 standard drinks    Types: 3 Glasses of wine per week    Comment: socially  . Drug use: Never  . Sexual activity: Yes    Birth control/protection: Surgical  Other Topics Concern  . Not on file  Social History Narrative  . Not on file   Social Determinants of Health   Financial Resource Strain:   . Difficulty of Paying Living Expenses:   Food Insecurity:   . Worried About  Programme researcher, broadcasting/film/video in the Last Year:   . Barista in the Last Year:   Transportation Needs:   . Freight forwarder (Medical):   Marland Kitchen Lack of Transportation (Non-Medical):   Physical Activity:   . Days of Exercise per Week:   . Minutes of Exercise per Session:   Stress:   . Feeling of Stress :   Social Connections:   . Frequency of Communication with Friends and Family:   . Frequency of Social Gatherings with Friends and Family:   . Attends Religious Services:   . Active Member of Clubs or Organizations:   . Attends Banker Meetings:   Marland Kitchen Marital Status:    Family History: Family History  Problem Relation Age of Onset  . Cancer Mother   . Hypertension Mother   . Heart attack Mother   . Breast cancer Mother   . Ovarian cancer Mother   . Dementia Mother   . Stroke Mother   . Aneurysm Mother   . Heart failure Father   . Hypertension Father   . Heart attack Father   . Breast cancer Maternal Aunt   . Breast cancer Maternal Grandmother   . Ovarian cancer Maternal Grandmother    Allergies: Allergies  Allergen Reactions  . Iodine Swelling    Lips swelling Lips swelling   . Cardizem [Diltiazem]   . Ivp Dye [Iodinated Diagnostic Agents]    Medications: See med rec.  Review of Systems: See HPI for pertinent positives and negatives.  Objective:    General: Well Developed, well nourished, and in no acute distress.  Neuro: Alert and oriented x3.  HEENT: Normocephalic, atraumatic.  Skin: Warm and dry. Cardiac: Regular rate and rhythm, no murmurs rubs or gallops, no lower extremity edema.  Respiratory: Clear to auscultation bilaterally. Not using accessory muscles, speaking in full sentences.  Impression and Recommendations:    1. Encounter for hepatitis C screening test for low risk patient Discussed screening recommendations.  Patient amenable to having this done so we will add this to her next blood draw. - Hepatitis C antibody  2. Urinary  incontinence, unspecified type Referring to urogynecology for further evaluation. - Ambulatory referral to Urogynecology  3. Anxiety with flying Okay to use Xanax 0.125-0.25 mg as needed for flights. - ALPRAZolam (XANAX) 0.25 MG tablet; Take 1 tablet (0.25 mg total) by mouth daily as needed for anxiety (related to flying).  Dispense: 10 tablet; Refill: 0  4. Essential hypertension Blood pressures at home in the 130s/80s.  Okay to continue losartan at 25 mg daily.    5. Left hip pain Discussed causes of left hip pain and potential treatments.  She does like to avoid NSAIDs due to having high blood pressure.  I do not think that a daily meloxicam would make that much of a difference with her blood pressure.  But for now, advised seeing Dr. Karie Schwalbe for evaluation and potential nonsurgical treatments of her hip discomfort.  6. Weight gain Continue Wellbutrin 150 mg daily.  Continue exercise and dietary modifications.  Total weight loss in the last 4 weeks approximately 1.5 pounds.  7. Vaginal atrophy Continue Imvexxy for now.  If this is not helpful in the next 4 weeks or so, we can look at alternatives to medication or dosing to see if something may work better for her.  Return in about 4 weeks (around 10/07/2019) for weight check. ___________________________________________ Thayer Ohm, DNP, APRN, FNP-BC Primary Care and Sports Medicine [facility]

## 2019-09-14 ENCOUNTER — Other Ambulatory Visit: Payer: Self-pay

## 2019-09-14 ENCOUNTER — Encounter: Payer: Self-pay | Admitting: Sports Medicine

## 2019-09-14 ENCOUNTER — Ambulatory Visit (INDEPENDENT_AMBULATORY_CARE_PROVIDER_SITE_OTHER): Payer: 59 | Admitting: Sports Medicine

## 2019-09-14 DIAGNOSIS — M1612 Unilateral primary osteoarthritis, left hip: Secondary | ICD-10-CM | POA: Insufficient documentation

## 2019-09-14 MED ORDER — CELECOXIB 200 MG PO CAPS: ORAL_CAPSULE | ORAL | 2 refills | Status: DC

## 2019-09-14 NOTE — Assessment & Plan Note (Signed)
This is a pleasant 59 year old female, she has multiple joint aches and pains, worse in the left hip, groin. On exam she has pain with internal rotation, she did have x-rays done at a [facility] facility a year ago that showed hip osteoarthritis, we will start conservatively, Celebrex as she does have a history of a hiatal hernia, formal physical therapy, return to see me in a month, hip joint injection if no better.

## 2019-09-14 NOTE — Progress Notes (Signed)
    Procedures performed today:    None.  Independent interpretation of notes and tests performed by another provider:   None.  Brief History, Exam, Impression, and Recommendations:    Nancy Bautista is a 59yo female who presents with left hip pain that has been goin on for over a year now. She is active and lifts weights 3x a week. She endorses some tingling in her toes occasionally. Xrays one year ago demonstrated mild left hip OA along with some degenerative changes of the lumbar spine. This is most likely pain from left hip OA. We are going to prescribe PT and celebrex with follow up in 4 weeks for further interventional planning if needed.   Aurelio Jew, MS3   ___________________________________________ [name]. [name], M.D., ABFM., CAQSM. Primary Care and Sports Medicine [facility]  Adjunct Instructor of Family Medicine  [employer]

## 2019-09-21 ENCOUNTER — Encounter: Payer: Self-pay | Admitting: Medical-Surgical

## 2019-09-24 ENCOUNTER — Encounter: Payer: Self-pay | Admitting: Rehabilitative and Restorative Service Providers"

## 2019-09-24 ENCOUNTER — Other Ambulatory Visit: Payer: Self-pay

## 2019-09-24 ENCOUNTER — Ambulatory Visit (INDEPENDENT_AMBULATORY_CARE_PROVIDER_SITE_OTHER): Payer: 59 | Admitting: Rehabilitative and Restorative Service Providers"

## 2019-09-24 DIAGNOSIS — R29898 Other symptoms and signs involving the musculoskeletal system: Secondary | ICD-10-CM

## 2019-09-24 DIAGNOSIS — M25552 Pain in left hip: Secondary | ICD-10-CM

## 2019-09-24 DIAGNOSIS — M6281 Muscle weakness (generalized): Secondary | ICD-10-CM

## 2019-09-24 MED ORDER — METOPROLOL TARTRATE 25 MG PO TABS: 12.5000 mg | ORAL_TABLET | Freq: Two times a day (BID) | ORAL | 2 refills | Status: DC | PRN

## 2019-09-24 NOTE — Therapy (Signed)
[facility] [address] [place], [place], [address] Phone: [phone]   Fax:  [phone]  Physical Therapy Evaluation  Patient Details  Name: Nancy Bautista MRN: [id number] Date of Birth: October 29, 1960 Referring Provider (PT): Dr [name]   Encounter Date: 09/24/2019   PT End of Session - 09/24/19 1714    Visit Number 1    Number of Visits 12    Date for PT Re-Evaluation 11/05/19    PT Start Time 1445    PT Stop Time 1532    PT Time Calculation (min) 47 min    Activity Tolerance Patient tolerated treatment well           Past Medical History:  Diagnosis Date   GERD (gastroesophageal reflux disease)    Hypertension    Migraines     Past Surgical History:  Procedure Laterality Date   ABDOMINAL HYSTERECTOMY     ABDOMINAL SURGERY     BREAST ENHANCEMENT SURGERY     TONSILLECTOMY     VAGINAL HYSTERECTOMY      There were no vitals filed for this visit.    Subjective Assessment - 09/24/19 1450    Subjective Patient reports that she has had Lt hip pain over the past year with no known injury. Patient also has pain in bilat shoulders and knees. She has a history of Rt foot for "arthritis" ~ 15 yrs ago    Pertinent History ~ 6 month history of various aches and pain and easily injured joints    Patient Stated Goals not hurt so much - pain free; walk better and do more like she used to do    Currently in Pain? Yes    Pain Score 4     Pain Location Hip    Pain Orientation Left    Pain Descriptors / Indicators Dull    Pain Type Chronic pain    Pain Radiating Towards sometimes into the back and leg    Pain Onset More than a month ago    Pain Frequency Intermittent   always present with movement   Aggravating Factors  bending; lifting; lying on the Lt side; stairs; prolonged sitting/standing/walking; exercise    Pain Relieving Factors hot bath; heat; NSAIDS short term improvement               [facility] PT Assessment - 09/24/19 0001      Assessment   Medical Diagnosis Lt hip pain; multiple joint pains     Referring Provider (PT) Dr [name]    Onset Date/Surgical Date 09/13/18    Hand Dominance Right    Next MD Visit 10/12/19    Prior Therapy chiropractic care; PT for LBP 2003       Precautions   Precautions None      Restrictions   Weight Bearing Restrictions No      Balance Screen   Has the patient fallen in the past 6 months No    Has the patient had a decrease in activity level because of a fear of falling?  No    Is the patient reluctant to leave their home because of a fear of falling?  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; some bending over  Leisure household chores; gym 3 day/wk ~ 1 hour - aerobic and wts - machines and free wts      Observation/Other Assessments   Focus on Therapeutic Outcomes (FOTO)  57% limitation       Sensation   Additional Comments numbness in UE's with arms overhead - resolves with changing positions      Posture/Postural Control   Posture Comments head forward; shoulders rounded and elevated; hip flexion       AROM   Lumbar Flexion 80%    Lumbar Extension 70%    Lumbar - Right Side Bend 70%    Lumbar - Left Side Bend 70% pulling into the Rt LB/posterior hip area     Lumbar - Right Rotation 30%    Lumbar - Left Rotation 30%      Strength   Right Hip Flexion 5/5    Right Hip Extension 5/5    Right Hip ABduction 5/5    Left Hip Flexion 4+/5    Left Hip Extension 5/5    Left Hip ABduction 4+/5      Flexibility   Hamstrings tight Lt > Rt     Quadriceps tight Lt > Rt     ITB tight LT     Piriformis tight Lt > Rt       Palpation   Spinal mobility WFL's lumbar spine     Palpation comment muscular tightness  through Lt > Rt psoas; hip flexors; piriformis; gluts; ITB                       Objective measurements completed on examination: See above findings.       [facility] Adult PT Treatment/Exercise - 09/24/19 0001      Knee/Hip Exercises: Stretches   Passive Hamstring Stretch Left;2 reps;30 seconds   supine with strap    Quad Stretch Left;2 reps;30 seconds   prone with strap    Hip Flexor Stretch Left;Right;2 reps;30 seconds   seated   ITB Stretch Left;2 reps;30 seconds   supine with strap   Piriformis Stretch Left;2 reps;30 seconds   supine with strap      Kinesiotix   Facilitate Muscle  1 strip posterior knee bilat to facilitate knee control - avoid hyperextension of bilat knees in standing                   PT Education - 09/24/19 1526    Education Details HEP POC DN TENS    Person(s) Educated Patient    Methods Explanation;Demonstration;Tactile cues;Verbal cues;Handout    Comprehension Verbalized understanding;Returned demonstration;Verbal cues required;Tactile cues required               PT Long Term Goals - 09/24/19 1720      PT LONG TERM GOAL #1   Title Decrease pain in the Lt hip by 50-75% allowing patient to walk and participate in normal funcitonal activities with less pain and greater ease    Time 6    Period Weeks    Status New    Target Date 11/05/19      PT LONG TERM GOAL #2   Title Improve tissue extensibility and ROM through bilat hips to allow greater ease of movement and decreased pain    Time 6    Period Weeks    Status New    Target Date 11/05/19      PT LONG TERM GOAL #3   Title 5/5 Lt hip strength  Time 6    Period Weeks    Status New    Target Date 11/05/19      PT LONG TERM GOAL #4   Title Independent in HEP with patient to report good tolerance for gym program 3 days/wk    Time 6    Period Weeks    Status New    Target Date 11/05/19      PT LONG TERM GOAL #5   Title Improve FOTO to </= 43% limitation    Time 6     Period Weeks    Status New    Target Date 11/05/19                  Plan - 09/24/19 1715    Clinical Impression Statement Patient presents with c/o pain in multiple joints with greatest pain in the Lt hip. She has abnormal posture and alignment; limited hip mobility; decreased strength Lt hip; significant muscular tightness through the Lt > Rt psoas, hip flexors, quads, piriformis, gluts,TFL. She will benefit from PT to address problems identified.    Stability/Clinical Decision Making Stable/Uncomplicated    Clinical Decision Making Low    Rehab Potential Good    PT Frequency 2x / week    PT Duration 6 weeks    PT Treatment/Interventions Patient/family education;ADLs/Self Care Home Management;Aquatic Therapy;Cryotherapy;Electrical Stimulation;Iontophoresis 4mg /ml Dexamethasone;Moist Heat;Ultrasound;Gait training;Stair training;Functional mobility training;Therapeutic activities;Therapeutic exercise;Balance training;Neuromuscular re-education;Manual techniques;Dry needling;Taping    PT Next Visit Plan review HEP; trial of DN and manual work posterior Lt hip; progression of stretching and strengthening; assess response to taping to discourage hyperextension of knees; modaliites as needed    PT Home Exercise Plan [email or web address]; VHI    Consulted and Agree with Plan of Care Patient           Patient will benefit from skilled therapeutic intervention in order to improve the following deficits and impairments:  Increased fascial restricitons, Increased muscle spasms, Pain, Decreased activity tolerance, Hypomobility, Impaired flexibility, Improper body mechanics, Decreased mobility, Decreased strength, Postural dysfunction  Visit Diagnosis: Pain in left hip - Plan: PT plan of care cert/re-cert  Other symptoms and signs involving the musculoskeletal system - Plan: PT plan of care cert/re-cert  Muscle weakness (generalized) - Plan: PT plan of care cert/re-cert     Problem List Patient  Active Problem List   Diagnosis Date Noted   Primary osteoarthritis of left hip 09/14/2019   Palpitations 05/11/2019   Essential hypertension 05/11/2019   Heart murmur, systolic 05/11/2019   Urinary incontinence 05/11/2019   Weight gain 05/11/2019    Nancy Bautista 05/13/2019 PT, MPH  09/24/2019, 5:28 PM  [facility] [address] [place], [place], [place] Phone: [phone]   Fax:  [phone]  Name: Nancy Bautista MRN: Elita Quick Date of Birth: 1960/10/22

## 2019-09-24 NOTE — Patient Instructions (Addendum)
  Access Code: [email or web address]: [email or web address]: 08/12/2021Prepared by: Shawnika Pepin HoltExercises  Prone Quadriceps Stretch with Strap - 2 x daily - 7 x weekly - 1 sets - 3 reps - 30 sec hold  Hooklying Hamstring Stretch with Strap - 2 x daily - 7 x weekly - 1 sets - 3 reps - 30 sec hold  Supine ITB Stretch with Strap - 2 x daily - 7 x weekly - 1 sets - 3 reps - 30 sec hold  Supine Piriformis Stretch with Leg Straight - 2 x daily - 7 x weekly - 1 sets - 3 reps - 30 sec hold Patient Education  Trigger Point Dry Needling  TENS Unit

## 2019-09-30 ENCOUNTER — Other Ambulatory Visit: Payer: Self-pay

## 2019-09-30 ENCOUNTER — Encounter: Payer: Self-pay | Admitting: Rehabilitative and Restorative Service Providers"

## 2019-09-30 ENCOUNTER — Ambulatory Visit (INDEPENDENT_AMBULATORY_CARE_PROVIDER_SITE_OTHER): Payer: 59 | Admitting: Rehabilitative and Restorative Service Providers"

## 2019-09-30 DIAGNOSIS — M6281 Muscle weakness (generalized): Secondary | ICD-10-CM

## 2019-09-30 DIAGNOSIS — M25552 Pain in left hip: Secondary | ICD-10-CM

## 2019-09-30 DIAGNOSIS — R29898 Other symptoms and signs involving the musculoskeletal system: Secondary | ICD-10-CM | POA: Diagnosis not present

## 2019-09-30 NOTE — Therapy (Addendum)
[facility] [place], [place], [address] Phone: [phone]   Fax:  [phone]  Physical Therapy Treatment and Discharge  Patient Details  Name: Nancy Bautista MRN: [id number] Date of Birth: [birth year] Referring Provider (PT): Dr Dianah Field   Encounter Date: 09/30/2019   PT End of Session - 09/30/19 1353    Visit Number 2    Number of Visits 12    Date for PT Re-Evaluation 11/05/19    PT Start Time 1450    PT Stop Time 1539    PT Time Calculation (min) 49 min    Activity Tolerance Patient tolerated treatment well           Past Medical History:  Diagnosis Date  . GERD (gastroesophageal reflux disease)   . Hypertension   . Migraines     Past Surgical History:  Procedure Laterality Date  . ABDOMINAL HYSTERECTOMY    . ABDOMINAL SURGERY    . BREAST ENHANCEMENT SURGERY    . TONSILLECTOMY    . VAGINAL HYSTERECTOMY      There were no vitals filed for this visit.   Subjective Assessment - 09/30/19 1355    Subjective Patient reports that the exercises have been helping with hip pain. She has been working on exercises at home. Tape has helped her avoid hyperextension of knees in standing. Trying to work on standing more upright.    Currently in Pain? No/denies    Pain Score 0-No pain                             [facility] Adult PT Treatment/Exercise - 09/30/19 0001      Therapeutic Activites    Therapeutic Activities --   myofacial ball release work standing      Knee/Hip Exercises: Stretches   Passive Hamstring Stretch Left;2 reps;30 seconds   supine with strap    Quad Stretch Left;2 reps;30 seconds   prone with strap    Hip Flexor Stretch Left;Right;2 reps;30 seconds   seated   ITB Stretch Left;2 reps;30 seconds   supine with strap   Piriformis Stretch Left;2 reps;30 seconds   supine with strap    Piriformis Stretch Limitations varying angles for pull including knee to  opposite shoulder 30 sec x 4 reps     Other Knee/Hip Stretches windshield wiper x 10-15 reps       Knee/Hip Exercises: Aerobic   Nustep L5 x 5 min U/LE's       Knee/Hip Exercises: Standing   Wall Squat 5 reps   20 sec hold      Knee/Hip Exercises: Seated   Sit to Sand 5 reps;without UE support   some Rt knee pain not given for HEP      Moist Heat Therapy   Number Minutes Moist Heat 10 Minutes    Moist Heat Location Lumbar Spine;Hip      Manual Therapy   Manual therapy comments skilled palpation to assess tissue response to DN and manual work     Soft tissue mobilization deep tissue work through the posterior Lt hip/buttocks    Myofascial Release Lt posterior hip             Trigger Point Dry Needling - 09/30/19 0001    Consent Given? Yes    Education Handout Provided Yes    Other Dry Needling Lt     Gluteus Medius Response Palpable increased muscle length  Gluteus Maximus Response Palpable increased muscle length    Piriformis Response Palpable increased muscle length                PT Education - 09/30/19 1418    Education Details DN HEP myofacial ball release work    Forensic psychologist) Educated Patient    Methods Explanation;Demonstration;Tactile cues;Verbal cues;Handout    Comprehension Verbalized understanding;Returned demonstration;Verbal cues required;Tactile cues required               PT Long Term Goals - 09/24/19 1720      PT LONG TERM GOAL #1   Title Decrease pain in the Lt hip by 50-75% allowing patient to walk and participate in normal funcitonal activities with less pain and greater ease    Time 6    Period Weeks    Status New    Target Date 11/05/19      PT LONG TERM GOAL #2   Title Improve tissue extensibility and ROM through bilat hips to allow greater ease of movement and decreased pain    Time 6    Period Weeks    Status New    Target Date 11/05/19      PT LONG TERM GOAL #3   Title 5/5 Lt hip strength    Time 6    Period Weeks     Status New    Target Date 11/05/19      PT LONG TERM GOAL #4   Title Independent in HEP with patient to report good tolerance for gym program 3 days/wk    Time 6    Period Weeks    Status New    Target Date 11/05/19      PT LONG TERM GOAL #5   Title Improve FOTO to </= 43% limitation    Time 6    Period Weeks    Status New    Target Date 11/05/19                 Plan - 09/30/19 1353    Clinical Impression Statement Good response to initial treatment and exercises. Less hip pain. Taping helped with avoiding hyperextension of knees in standing. Trial of DN to Lt posterior hip tolerated well.    Rehab Potential Good    PT Frequency 2x / week    PT Duration 6 weeks    PT Treatment/Interventions Patient/family education;ADLs/Self Care Home Management;Aquatic Therapy;Cryotherapy;Electrical Stimulation;Iontophoresis 47m/ml Dexamethasone;Moist Heat;Ultrasound;Gait training;Stair training;Functional mobility training;Therapeutic activities;Therapeutic exercise;Balance training;Neuromuscular re-education;Manual techniques;Dry needling;Taping    PT Next Visit Plan review HEP; assess response to trial of DN and manual work posterior Lt hip; progression of stretching and strengthening; continue taping to discourage hyperextension of knees as needed; modaliites as needed    PT Home Exercise Plan [email or web address]; VHI    Consulted and Agree with Plan of Care Patient           Patient will benefit from skilled therapeutic intervention in order to improve the following deficits and impairments:     Visit Diagnosis: Pain in left hip  Other symptoms and signs involving the musculoskeletal system  Muscle weakness (generalized)     Problem List Patient Active Problem List   Diagnosis Date Noted  . Primary osteoarthritis of left hip 09/14/2019  . Palpitations 05/11/2019  . Essential hypertension 05/11/2019  . Heart murmur, systolic 088/32/5498 . Urinary incontinence 05/11/2019  . Weight  gain 05/11/2019   PHYSICAL THERAPY DISCHARGE SUMMARY  Visits from Start of Care: 2  Current functional  level related to goals / functional outcomes: Goals not met   Remaining deficits: See above   Education / Equipment: HEP Plan: Patient agrees to discharge.  Patient goals were not met. Patient is being discharged due to not returning since the last visit.  ?????      Daysy Santini Nilda Simmer PT, MPH  09/30/2019, 2:31 PM  [facility] [place], [place], [address] Phone: [phone]   Fax:  [phone]  Name: Nancy Bautista MRN: [id number] Date of Birth: [birth year]

## 2019-09-30 NOTE — Patient Instructions (Signed)

## 2019-10-05 ENCOUNTER — Encounter: Payer: 59 | Admitting: Rehabilitative and Restorative Service Providers"

## 2019-10-07 ENCOUNTER — Encounter: Payer: 59 | Admitting: Rehabilitative and Restorative Service Providers"

## 2019-10-07 ENCOUNTER — Encounter: Payer: Self-pay | Admitting: Medical-Surgical

## 2019-10-07 ENCOUNTER — Other Ambulatory Visit: Payer: Self-pay

## 2019-10-07 ENCOUNTER — Ambulatory Visit (INDEPENDENT_AMBULATORY_CARE_PROVIDER_SITE_OTHER): Payer: 59 | Admitting: Medical-Surgical

## 2019-10-07 VITALS — BP 133/85 | HR 54 | Temp 97.8°F | Ht 66.0 in | Wt 155.2 lb

## 2019-10-07 DIAGNOSIS — Z23 Encounter for immunization: Secondary | ICD-10-CM

## 2019-10-07 DIAGNOSIS — M25552 Pain in left hip: Secondary | ICD-10-CM | POA: Diagnosis not present

## 2019-10-07 DIAGNOSIS — R32 Unspecified urinary incontinence: Secondary | ICD-10-CM | POA: Diagnosis not present

## 2019-10-07 DIAGNOSIS — R635 Abnormal weight gain: Secondary | ICD-10-CM | POA: Diagnosis not present

## 2019-10-07 DIAGNOSIS — R002 Palpitations: Secondary | ICD-10-CM | POA: Diagnosis not present

## 2019-10-07 NOTE — Progress Notes (Signed)
Subjective:    CC: weight check  HPI: Pleasant 59 year old female presenting today for weight check. Taking Wellbutrin 150mg  daily, tolerating well without side effects. Has continued her dietary modifications.  Continues to exercise as time permits.  Had to cancel her trip to due to Covid but is still hopeful that her wedding will proceed as planned in 4 weeks.  Her goal weight is 140-150 pounds.  Taking metoprolol 12.5 mg twice daily for palpitations.  Admits that she sometimes omits the evening dose but she is still doing well and her palpitations remain controlled.  Continues to have some left hip pain but she has started physical therapy for this and notes that it is helping.  Continues to experience vaginal dryness despite use of Imvexxy twice weekly.  She has an appointment in October with UroGyn to evaluate bladder leakage and discuss options for treating vaginal dryness.  I reviewed the past medical history, family history, social history, surgical history, and allergies today and no changes were needed.  Please see the problem list section below in epic for further details.  Past Medical History: Past Medical History:  Diagnosis Date  . GERD (gastroesophageal reflux disease)   . Hypertension   . Migraines    Past Surgical History: Past Surgical History:  Procedure Laterality Date  . ABDOMINAL HYSTERECTOMY    . ABDOMINAL SURGERY    . BREAST ENHANCEMENT SURGERY    . TONSILLECTOMY    . VAGINAL HYSTERECTOMY     Social History: Social History   Socioeconomic History  . Marital status: Single    Spouse name: Not on file  . Number of children: 3  . Years of education: Not on file  . Highest education level: Not on file  Occupational History  . Not on file  Tobacco Use  . Smoking status: Former November  . Smokeless tobacco: Never Used  Vaping Use  . Vaping Use: Never used  Substance and Sexual Activity  . Alcohol use: Yes    Alcohol/week: 3.0 standard  drinks    Types: 3 Glasses of wine per week    Comment: socially  . Drug use: Never  . Sexual activity: Yes    Birth control/protection: Surgical  Other Topics Concern  . Not on file  Social History Narrative  . Not on file   Social Determinants of Health   Financial Resource Strain:   . Difficulty of Paying Living Expenses: Not on file  Food Insecurity:   . Worried About Games developer in the Last Year: Not on file  . Ran Out of Food in the Last Year: Not on file  Transportation Needs:   . Lack of Transportation (Medical): Not on file  . Lack of Transportation (Non-Medical): Not on file  Physical Activity:   . Days of Exercise per Week: Not on file  . Minutes of Exercise per Session: Not on file  Stress:   . Feeling of Stress : Not on file  Social Connections:   . Frequency of Communication with Friends and Family: Not on file  . Frequency of Social Gatherings with Friends and Family: Not on file  . Attends Religious Services: Not on file  . Active Member of Clubs or Organizations: Not on file  . Attends Programme researcher, broadcasting/film/video Meetings: Not on file  . Marital Status: Not on file   Family History: Family History  Problem Relation Age of Onset  . Cancer Mother   . Hypertension Mother   . Heart  attack Mother   . Breast cancer Mother   . Ovarian cancer Mother   . Dementia Mother   . Stroke Mother   . Aneurysm Mother   . Heart failure Father   . Hypertension Father   . Heart attack Father   . Breast cancer Maternal Aunt   . Breast cancer Maternal Grandmother   . Ovarian cancer Maternal Grandmother    Allergies: Allergies  Allergen Reactions  . Iodine Swelling    Lips swelling Lips swelling   . Cardizem [Diltiazem]   . Ivp Dye [Iodinated Diagnostic Agents]    Medications: See med rec.  Review of Systems: No fevers, chills, night sweats, weight loss, chest pain, or shortness of breath.   Objective:    General: Well Developed, well nourished, and in no  acute distress.  Neuro: Alert and oriented x3, extra-ocular muscles intact, sensation grossly intact.  HEENT: Normocephalic, atraumatic, pupils equal round reactive to light, neck supple, no masses, no lymphadenopathy, thyroid nonpalpable.  Skin: Warm and dry, no rashes. Cardiac: Regular rate and rhythm, no murmurs rubs or gallops, no lower extremity edema.  Respiratory: Clear to auscultation bilaterally. Not using accessory muscles, speaking in full sentences.   Impression and Recommendations:    1. Weight gain Another 2.5 pound weight loss.  She is doing amazing with her progress.  Continue Wellbutrin 150 mg daily.  Continue lifestyle modifications and including diet and exercise.  She has done so well and Wellbutrin is not considered a controlled medication, we will push her follow-up out to 3 months to allow her to get through her wedding and honeymoon.  2. Palpitations Continue metoprolol 12.5 mg twice daily as needed.  3. Left hip pain Continue physical therapy.  Okay to use anti-inflammatories as needed.  4. Urinary incontinence, unspecified type Follow-up with UroGyn as planned.  5. Need for influenza vaccination Flu shot administered in office by MA. - Flu Vaccine QUAD 36+ mos IM  Return in about 3 months (around 01/07/2020) for weight/general follow up. ___________________________________________ Thayer Ohm, DNP, APRN, FNP-BC Primary Care and Sports Medicine [facility]

## 2019-10-12 ENCOUNTER — Telehealth: Payer: Self-pay | Admitting: Rehabilitative and Restorative Service Providers"

## 2019-10-12 ENCOUNTER — Encounter: Payer: 59 | Admitting: Rehabilitative and Restorative Service Providers"

## 2019-10-12 ENCOUNTER — Ambulatory Visit (INDEPENDENT_AMBULATORY_CARE_PROVIDER_SITE_OTHER): Payer: 59 | Admitting: Sports Medicine

## 2019-10-12 ENCOUNTER — Encounter: Payer: Self-pay | Admitting: Sports Medicine

## 2019-10-12 ENCOUNTER — Other Ambulatory Visit: Payer: Self-pay

## 2019-10-12 DIAGNOSIS — M1612 Unilateral primary osteoarthritis, left hip: Secondary | ICD-10-CM | POA: Diagnosis not present

## 2019-10-12 NOTE — Telephone Encounter (Signed)
Patient did not show for scheduled PT visit., LM asking that she contact our office to schedule additional appointments as needed or notify us if she would like to discontinue PT. She was seen for two PT visits.   Nancy Bautista PT, MPH 10/12/19 3:04 PM

## 2019-10-12 NOTE — Progress Notes (Signed)
    Procedures performed today:    None.  Independent interpretation of notes and tests performed by another provider:   None.  Brief History, Exam, Impression, and Recommendations:    Nancy Bautista is a pleasant 59yo female who presents today for follow up of left hip pain caused by primary OA. PT has improved her pain about 30% over the past month. She feels like she has hit a plateau with PT and is open to taking celebrex. She is going to start with half of a 200mg  pill each day and titrate up to 400mg  a day as needed. She is going to continue at home exercises. We are going to follow up with her in 4-6 weeks to reeevaluate and determine if further intervention such as hip injection would be needed.   , MS3   ___________________________________________ . Aurelio Jew, M.D., ABFM., CAQSM. Primary Care and Sports Medicine [facility]  Adjunct Instructor of Family Medicine  [employer]

## 2019-10-12 NOTE — Assessment & Plan Note (Signed)
This is a very pleasant 58 year old female, multiple joint aches and pains, but particularly in the left hip/groin, x-rays done at [facility] facility did show osteoarthritis, she started some therapy, 30% improvement, has not yet done Celebrex. She will gradually uptitrate Celebrex starting with 1/2 pill daily, and going up to a full pill twice daily if needed. If insufficient improvement after a month we will inject her hip joint.

## 2019-10-14 ENCOUNTER — Encounter: Payer: Self-pay | Admitting: Medical-Surgical

## 2019-10-19 ENCOUNTER — Encounter: Payer: Self-pay | Admitting: Medical-Surgical

## 2019-10-23 ENCOUNTER — Ambulatory Visit (INDEPENDENT_AMBULATORY_CARE_PROVIDER_SITE_OTHER): Payer: 59 | Admitting: Medical-Surgical

## 2019-10-23 ENCOUNTER — Encounter: Payer: Self-pay | Admitting: Medical-Surgical

## 2019-10-23 VITALS — BP 143/79 | HR 61 | Temp 98.0°F | Wt 157.0 lb

## 2019-10-23 DIAGNOSIS — H00033 Abscess of eyelid right eye, unspecified eyelid: Secondary | ICD-10-CM | POA: Diagnosis not present

## 2019-10-23 DIAGNOSIS — H01001 Unspecified blepharitis right upper eyelid: Secondary | ICD-10-CM | POA: Diagnosis not present

## 2019-10-23 MED ORDER — FLUCONAZOLE 150 MG PO TABS: 150.0000 mg | ORAL_TABLET | Freq: Once | ORAL | 0 refills | Status: AC

## 2019-10-23 MED ORDER — ERYTHROMYCIN 5 MG/GM OP OINT: 1.0000 "application " | TOPICAL_OINTMENT | Freq: Three times a day (TID) | OPHTHALMIC | 0 refills | Status: AC

## 2019-10-23 MED ORDER — DOXYCYCLINE HYCLATE 100 MG PO TABS: 100.0000 mg | ORAL_TABLET | Freq: Two times a day (BID) | ORAL | 0 refills | Status: AC

## 2019-10-23 NOTE — Progress Notes (Signed)
Subjective:    CC: eyelid swelling  HPI: Pleasant 59 year old female presenting for evaluation of her right eye. Approximately 3 weeks ago, she had a small bump for on her right upper eye lid near the water line. It appeared to be a pimple so she started using warm compresses. Unfortunately, the bump increased in size and then began to drain thick white fluid. She began using over the counter stye eye drops but that was not helpful. Over the past few weeks, the sore has gotten red, swollen and painful before opening up and draining again. Last night she noted that it had a huge whitehead that popped on its own and drained quite a bit of pus. She originally thought the bump was related to a bad contact and has avoided wearing her contacts for the last 3 weeks. She does have lash extension in place and has an appointment this afternoon to get them redone. Her wedding is in 15 days and she is anxious to have this resolved. Denies fever, chills, eye pain, and vision changes.  I reviewed the past medical history, family history, social history, surgical history, and allergies today and no changes were needed.  Please see the problem list section below in epic for further details.  Past Medical History: Past Medical History:  Diagnosis Date  . GERD (gastroesophageal reflux disease)   . Hypertension   . Migraines    Past Surgical History: Past Surgical History:  Procedure Laterality Date  . ABDOMINAL HYSTERECTOMY    . ABDOMINAL SURGERY    . BREAST ENHANCEMENT SURGERY    . TONSILLECTOMY    . VAGINAL HYSTERECTOMY     Social History: Social History   Socioeconomic History  . Marital status: Single    Spouse name: Not on file  . Number of children: 3  . Years of education: Not on file  . Highest education level: Not on file  Occupational History  . Not on file  Tobacco Use  . Smoking status: Former Games developer  . Smokeless tobacco: Never Used  Vaping Use  . Vaping Use: Never used   Substance and Sexual Activity  . Alcohol use: Yes    Alcohol/week: 3.0 standard drinks    Types: 3 Glasses of wine per week    Comment: socially  . Drug use: Never  . Sexual activity: Yes    Birth control/protection: Surgical  Other Topics Concern  . Not on file  Social History Narrative  . Not on file   Social Determinants of Health   Financial Resource Strain:   . Difficulty of Paying Living Expenses: Not on file  Food Insecurity:   . Worried About Programme researcher, broadcasting/film/video in the Last Year: Not on file  . Ran Out of Food in the Last Year: Not on file  Transportation Needs:   . Lack of Transportation (Medical): Not on file  . Lack of Transportation (Non-Medical): Not on file  Physical Activity:   . Days of Exercise per Week: Not on file  . Minutes of Exercise per Session: Not on file  Stress:   . Feeling of Stress : Not on file  Social Connections:   . Frequency of Communication with Friends and Family: Not on file  . Frequency of Social Gatherings with Friends and Family: Not on file  . Attends Religious Services: Not on file  . Active Member of Clubs or Organizations: Not on file  . Attends Banker Meetings: Not on file  . Marital Status:  Not on file   Family History: Family History  Problem Relation Age of Onset  . Cancer Mother   . Hypertension Mother   . Heart attack Mother   . Breast cancer Mother   . Ovarian cancer Mother   . Dementia Mother   . Stroke Mother   . Aneurysm Mother   . Heart failure Father   . Hypertension Father   . Heart attack Father   . Breast cancer Maternal Aunt   . Breast cancer Maternal Grandmother   . Ovarian cancer Maternal Grandmother    Allergies: Allergies  Allergen Reactions  . Iodine Swelling    Lips swelling Lips swelling   . Cardizem [Diltiazem]   . Ivp Dye [Iodinated Diagnostic Agents]    Medications: See med rec.  Review of Systems: See HPI for pertinent positives and negatives.   Objective:     General: Well Developed, well nourished, and in no acute distress.  Neuro: Alert and oriented x3.  HEENT: Normocephalic, atraumatic. Small scabbed lesion to middle of the right upper eye lid along the lash/water line. No drainage at present. Erythema and swelling to the middle of the right upper eyelid. Sclera white, not injected. No drainage from the eyes. Lash extensions in place.   Skin: Warm and dry. Cardiac: Regular rate and rhythm, no murmurs rubs or gallops, no lower extremity edema.  Respiratory: Clear to auscultation bilaterally. Not using accessory muscles, speaking in full sentences.   Impression and Recommendations:    1. Blepharitis/abscess of right upper eyelid Continue warm compresses. Erythromycin ointment to the right eyelid TID x 5 days. Doxycycline BID x 5 days. Fluconazole 150mg  sent for yeast infection prophylaxis. Recommend rescheduling appointment for lash extensions until full resolution. Continue to avoid contacts until full resolution.    Return if symptoms worsen or fail to improve. ___________________________________________ , DNP, APRN, FNP-BC Primary Care and Sports Medicine [facility]

## 2019-10-26 ENCOUNTER — Other Ambulatory Visit: Payer: Self-pay | Admitting: Medical-Surgical

## 2019-10-26 DIAGNOSIS — N952 Postmenopausal atrophic vaginitis: Secondary | ICD-10-CM

## 2019-10-30 ENCOUNTER — Other Ambulatory Visit: Payer: Self-pay | Admitting: Medical-Surgical

## 2019-10-30 DIAGNOSIS — I10 Essential (primary) hypertension: Secondary | ICD-10-CM

## 2019-10-30 MED ORDER — LOSARTAN POTASSIUM 25 MG PO TABS: 25.0000 mg | ORAL_TABLET | Freq: Every day | ORAL | 0 refills | Status: DC

## 2019-12-21 ENCOUNTER — Emergency Department
Admission: EM | Admit: 2019-12-21 | Discharge: 2019-12-21 | Disposition: A | Discharge status: Home / Self Care | Payer: 59 | Source: Home / Self Care

## 2019-12-21 ENCOUNTER — Encounter: Payer: Self-pay | Admitting: Medical-Surgical

## 2019-12-21 ENCOUNTER — Other Ambulatory Visit: Payer: Self-pay

## 2019-12-21 DIAGNOSIS — H6983 Other specified disorders of Eustachian tube, bilateral: Secondary | ICD-10-CM

## 2019-12-21 DIAGNOSIS — H66004 Acute suppurative otitis media without spontaneous rupture of ear drum, recurrent, right ear: Secondary | ICD-10-CM

## 2019-12-21 MED ORDER — CEPHALEXIN 500 MG PO CAPS: 500.0000 mg | ORAL_CAPSULE | Freq: Three times a day (TID) | ORAL | 0 refills | Status: AC

## 2019-12-21 MED ORDER — PREDNISONE 20 MG PO TABS: 40.0000 mg | ORAL_TABLET | Freq: Every day | ORAL | 0 refills | Status: DC

## 2019-12-21 NOTE — ED Provider Notes (Signed)
[facility]    CSN: [id number] Arrival date & time: 12/21/19  0810      History   Chief Complaint Chief Complaint  Patient presents with  . Otalgia    HPI Nancy Bautista is a 59 y.o. female.   HPI  Patient presents today with bilateral otalgia which apparently has been a chronic issue. Patient endorses pain extending down to her neck and the sensation of ear fullness. She is afebrile denies any other symptoms.  She endorses that the right ear is more painful than the left.  She also has some preauricular and tragus tenderness.  Endorses a mild sinus symptoms however denies any pronounced nasal congestion, cough or postnasal drainage.  She has been having inner ear issues for over a year.  Denies any fever or any other concerning symptoms today.  She has not been seen by an ENT however has been treated with an extended course of antibiotics earlier in the year which did not relieve symptoms which have been intermittently occurring since that time.  Past Medical History:  Diagnosis Date  . GERD (gastroesophageal reflux disease)   . Hypertension   . Migraines     Patient Active Problem List   Diagnosis Date Noted  . Primary osteoarthritis of left hip 09/14/2019  . Palpitations 05/11/2019  . Essential hypertension 05/11/2019  . Heart murmur, systolic 05/11/2019  . Urinary incontinence 05/11/2019  . Weight gain 05/11/2019    Past Surgical History:  Procedure Laterality Date  . ABDOMINAL HYSTERECTOMY    . ABDOMINAL SURGERY    . BREAST ENHANCEMENT SURGERY    . TONSILLECTOMY    . VAGINAL HYSTERECTOMY      OB History   No obstetric history on file.      Home Medications    Prior to Admission medications   Medication Sig Start Date End Date Taking? Authorizing Provider  ALPRAZolam Prudy Feeler) 0.25 MG tablet Take 1 tablet (0.25 mg total) by mouth daily as needed for anxiety (related to flying). 09/09/19   Christen Butter, NP  baclofen (LIORESAL) 10 MG tablet  PLEASE SEE ATTACHED FOR DETAILED DIRECTIONS 10/29/18   [provider]  buPROPion (WELLBUTRIN XL) 150 MG 24 hr tablet Take 1 tablet (150 mg total) by mouth every morning. 07/14/19   Christen Butter, NP  celecoxib (CELEBREX) 200 MG capsule One to 2 tablets by mouth daily as needed for pain. 09/14/19   Monica Becton, MD  IMVEXXY MAINTENANCE PACK 4 MCG INST PLACE ONE TAB IN VAGINA EVERY MONDAY AND THURSDAY 10/26/19   Christen Butter, NP  losartan (COZAAR) 25 MG tablet Take 1 tablet (25 mg total) by mouth daily. 10/30/19   Christen Butter, NP  metoprolol tartrate (LOPRESSOR) 25 MG tablet Take 0.5 tablets (12.5 mg total) by mouth 2 (two) times daily as needed. 09/24/19   Christen Butter, NP  omeprazole (PRILOSEC) 40 MG capsule Take 40 mg by mouth daily. 12/01/18   [provider]  VITAMIN D PO Take 2,000 Int'l Units by mouth daily.    [provider]    Family History Family History  Problem Relation Age of Onset  . Cancer Mother   . Hypertension Mother   . Heart attack Mother   . Breast cancer Mother   . Ovarian cancer Mother   . Dementia Mother   . Stroke Mother   . Aneurysm Mother   . Heart failure Father   . Hypertension Father   . Heart attack Father   . Breast  cancer Maternal Aunt   . Breast cancer Maternal Grandmother   . Ovarian cancer Maternal Grandmother     Social History Social History   Tobacco Use  . Smoking status: Former Games developer  . Smokeless tobacco: Never Used  Vaping Use  . Vaping Use: Never used  Substance Use Topics  . Alcohol use: Yes    Alcohol/week: 3.0 standard drinks    Types: 3 Glasses of wine per week    Comment: socially  . Drug use: Never     Allergies   Iodine, Cardizem [diltiazem], and Ivp dye [iodinated diagnostic agents]  Review of Systems Review of Systems Pertinent negatives listed in HPI  Physical Exam Triage Vital Signs ED Triage Vitals  Enc Vitals Group     BP 12/21/19 0826 (!) 152/93     Pulse Rate 12/21/19 0826 62      Resp 12/21/19 0826 16     Temp 12/21/19 0826 98.1 F (36.7 C)     Temp Source 12/21/19 0826 Oral     SpO2 12/21/19 0826 98 %     Weight --      Height --      Head Circumference --      Peak Flow --      Pain Score 12/21/19 0825 4     Pain Loc --      Pain Edu? --      Excl. in GC? --    No data found.  Updated Vital Signs BP (!) 152/93 (BP Location: Right Arm)   Pulse 62   Temp 98.1 F (36.7 C) (Oral)   Resp 16   SpO2 98%   Visual Acuity Right Eye Distance:   Left Eye Distance:   Bilateral Distance:    Right Eye Near:   Left Eye Near:    Bilateral Near:     Physical Exam Constitutional:      Appearance: Normal appearance.  HENT:     Right Ear: Hearing normal. Swelling and tenderness present. A middle ear effusion is present. Tympanic membrane is erythematous.     Left Ear: Hearing and external ear normal. Tenderness present.  Cardiovascular:     Rate and Rhythm: Normal rate and regular rhythm.  Pulmonary:     Effort: Pulmonary effort is normal.     Breath sounds: Normal breath sounds.  Musculoskeletal:     Cervical back: Normal range of motion.  Lymphadenopathy:     Cervical: Cervical adenopathy present.  Neurological:     Mental Status: She is alert.  Psychiatric:        Attention and Perception: Attention and perception normal.        Mood and Affect: Mood normal.        Speech: Speech normal.      UC Treatments / Results  Labs (all labs ordered are listed, but only abnormal results are displayed) Labs Reviewed - No data to display  EKG   Radiology No results found.  Procedures Procedures (including critical care time)  Medications Ordered in UC Medications - No data to display  Initial Impression / Assessment and Plan / UC Course  I have reviewed the triage vital signs and the nursing notes.  Pertinent labs & imaging results that were available during my care of the patient were reviewed by me and considered in my medical decision  making (see chart for details).     Treating for both acute otitis media involving the right ear and bilateral ET dysfunction.  Will  cover with prednisone 40 mg once daily x5 days to reduce inflammation and swelling.  Will also cover with 7 days of Keflex 3 times daily.  Patient advised to schedule follow-up with ENT if symptoms do not completely resolve with prescribed treatment. Final Clinical Impressions(s) / UC Diagnoses   Final diagnoses:  Acute dysfunction of both eustachian tubes  Recurrent acute suppurative otitis media of right ear without spontaneous rupture of tympanic membrane     Discharge Instructions     If treatment fails to improve current symptoms, follow-up with an ear nose and throat specialist.    ED Prescriptions    Medication Sig Dispense Auth. Provider   cephALEXin (KEFLEX) 500 MG capsule Take 1 capsule (500 mg total) by mouth 3 (three) times daily for 7 days. 20 capsule Bing Neighbors, FNP   predniSONE (DELTASONE) 20 MG tablet Take 2 tablets (40 mg total) by mouth daily with breakfast. 10 tablet Bing Neighbors, FNP     PDMP not reviewed this encounter.   Bing Neighbors, FNP 12/21/19 [phone]

## 2019-12-21 NOTE — Discharge Instructions (Addendum)
If treatment fails to improve current symptoms, follow-up with an ear nose and throat specialist.

## 2019-12-21 NOTE — ED Triage Notes (Signed)
Patient presents to Urgent Care with complaints of bilateral ear pain since about 4-5 years ago. Patient reports she went to her PCP when it started and she saw some fluid in her ears, was prescribed an antibiotic drop and used it all. Still feels like there is fluid in there and the pain is starting to extend down her neck as well. Pt has a flight later today.

## 2019-12-22 ENCOUNTER — Encounter: Payer: Self-pay | Admitting: Medical-Surgical

## 2019-12-22 ENCOUNTER — Telehealth (INDEPENDENT_AMBULATORY_CARE_PROVIDER_SITE_OTHER): Payer: 59 | Admitting: Medical-Surgical

## 2019-12-22 DIAGNOSIS — I10 Essential (primary) hypertension: Secondary | ICD-10-CM | POA: Diagnosis not present

## 2019-12-22 NOTE — Progress Notes (Signed)
Virtual Visit via Video Note  I connected with Nancy Bautista on 12/22/19 at  4:00 PM EST by a video enabled telemedicine application and verified that I am speaking with the correct person using two identifiers.   I discussed the limitations of evaluation and management by telemedicine and the availability of in person appointments. The patient expressed understanding and agreed to proceed.  Patient location: home Provider locations: office  Subjective:    CC: elevated BP  HPI: Pleasant 59 year old female presenting via MyChart video visit with reports of consistently elevated blood pressure readings over the last few months. She is currently taking Losartan 25mg  daily and Metoprolol 12.5mg  once daily, tolerating both well with no side effects. At home her readings have been borderline at 130s/80s but her last few appointments have had her pressures in the 140-150s/ 79-90s. Her most recent visit was at [facility] yesterday where her pressure was 150s/90s. Was married recently and has been travelling over the past several weeks. Denies CP, SOB, palpitations, lower extremity edema, dizziness, headaches, or vision changes.  Past medical history, Surgical history, Family history not pertinant except as noted below, Social history, Allergies, and medications have been entered into the medical record, reviewed, and corrections made.   Review of Systems: See HPI for pertinent positives and negatives.   Objective:    General: Speaking clearly in complete sentences without any shortness of breath.  Alert and oriented x3.  Normal judgment. No apparent acute distress.  Impression and Recommendations:    1. Essential hypertension Increase Losartan to 50mg  daily. Continue Metoprolol 12.5mg  daily. Monitor BP closely with the goal of 130/80 or less. Recommend low sodium dietary choices although this can be difficulty when travelling. Due to her busy travel schedule, we will follow up at her appointment  already scheduled in early December. She will monitor her BP and let me know if there are any concerns.   I discussed the assessment and treatment plan with the patient. The patient was provided an opportunity to ask questions and all were answered. The patient agreed with the plan and demonstrated an understanding of the instructions.   The patient was advised to call back or seek an in-person evaluation if the symptoms worsen or if the condition fails to improve as anticipated.  20 minutes of non-face-to-face time was provided during this encounter.  Return for appointment in early December as scheduled.  January, DNP, APRN, FNP-BC [facility]

## 2019-12-30 ENCOUNTER — Encounter: Payer: Self-pay | Admitting: Medical-Surgical

## 2019-12-30 DIAGNOSIS — H938X3 Other specified disorders of ear, bilateral: Secondary | ICD-10-CM

## 2019-12-30 DIAGNOSIS — I10 Essential (primary) hypertension: Secondary | ICD-10-CM

## 2019-12-30 MED ORDER — LOSARTAN POTASSIUM 50 MG PO TABS: 50.0000 mg | ORAL_TABLET | Freq: Every day | ORAL | 1 refills | Status: DC

## 2020-01-13 ENCOUNTER — Ambulatory Visit: Payer: 59 | Admitting: Medical-Surgical

## 2020-01-18 LAB — HM MAMMOGRAPHY

## 2020-01-19 ENCOUNTER — Ambulatory Visit (INDEPENDENT_AMBULATORY_CARE_PROVIDER_SITE_OTHER): Payer: 59 | Admitting: Medical-Surgical

## 2020-01-19 ENCOUNTER — Other Ambulatory Visit: Payer: Self-pay

## 2020-01-19 ENCOUNTER — Encounter: Payer: Self-pay | Admitting: Medical-Surgical

## 2020-01-19 VITALS — BP 124/79 | HR 64 | Temp 98.5°F | Ht 66.0 in | Wt 160.3 lb

## 2020-01-19 DIAGNOSIS — R002 Palpitations: Secondary | ICD-10-CM | POA: Diagnosis not present

## 2020-01-19 DIAGNOSIS — I10 Essential (primary) hypertension: Secondary | ICD-10-CM | POA: Diagnosis not present

## 2020-01-19 DIAGNOSIS — N952 Postmenopausal atrophic vaginitis: Secondary | ICD-10-CM

## 2020-01-19 DIAGNOSIS — H938X3 Other specified disorders of ear, bilateral: Secondary | ICD-10-CM

## 2020-01-19 DIAGNOSIS — F418 Other specified anxiety disorders: Secondary | ICD-10-CM

## 2020-01-19 DIAGNOSIS — R32 Unspecified urinary incontinence: Secondary | ICD-10-CM | POA: Diagnosis not present

## 2020-01-19 MED ORDER — LOSARTAN POTASSIUM 25 MG PO TABS: 25.0000 mg | ORAL_TABLET | Freq: Every day | ORAL | Status: DC

## 2020-01-19 NOTE — Progress Notes (Signed)
Subjective:    CC: general follow up  HPI: Pleasant 59 year old female presenting today for a general follow up.   Weight- did well losing weight with diet/exercise and Wellbutrin. She has gotten married and been travelling quite a bit. It has been hard to eat right while travelling so she has gained a little bit back.   Palpitations- stable on Metoprolol 12.5mg  BID as needed. Most often takes only one dose during the day.   HTN- recent increase in Losartan to 50mg  daily but her BP started dropping too low and she felt bad. She has reduced back to the 25mg  daily dose and is tolerating that much better. Denies CP, SOB, palpitations, lower extremity edema, dizziness, headaches, or vision changes.  Migraines- having intermittent normal headaches but no migraines. Headaches responsive to OTC measures.   Urinary incontinence-she had her urethral sling placed, tolerated the procedure well and recovered without difficulty.  No further issues with urinary incontinence.  Vaginal atrophy-has been started on Osphena daily.  Notes this is working better than the Imvexxy.  This is managed by her OB/GYN.  Ear pressure-has an appointment with ENT tomorrow to evaluate her continued ear pain/pressure and drainage.  She has had 2 rounds of antibiotics as well as 2 rounds of steroids but her symptoms have not resolved.  Depression/anxiety-taking Wellbutrin 150 mg daily, tolerating well.  Notes that she does worry often about bad things happening to her but this has been her normal for most of her life.  As she has gotten older she does note that she worries a little more about things.  Wonders if she needs to continue the Wellbutrin or if she should stop.  Admits that she is a little worried about stopping the Wellbutrin.  I reviewed the past medical history, family history, social history, surgical history, and allergies today and no changes were needed.  Please see the problem list section below in epic for  further details.  Past Medical History: Past Medical History:  Diagnosis Date  . GERD (gastroesophageal reflux disease)   . Hypertension   . Migraines    Past Surgical History: Past Surgical History:  Procedure Laterality Date  . ABDOMINAL HYSTERECTOMY    . ABDOMINAL SURGERY    . BREAST ENHANCEMENT SURGERY    . TONSILLECTOMY    . VAGINAL HYSTERECTOMY     Social History: Social History   Socioeconomic History  . Marital status: Single    Spouse name: Not on file  . Number of children: 3  . Years of education: Not on file  . Highest education level: Not on file  Occupational History  . Not on file  Tobacco Use  . Smoking status: Former  . Smokeless tobacco: Never Used  Vaping Use  . Vaping Use: Never used  Substance and Sexual Activity  . Alcohol use: Yes    Alcohol/week: 3.0 standard drinks    Types: 3 Glasses of wine per week    Comment: socially  . Drug use: Never  . Sexual activity: Yes    Birth control/protection: Surgical  Other Topics Concern  . Not on file  Social History Narrative  . Not on file   Social Determinants of Health   Financial Resource Strain:   . Difficulty of Paying Living Expenses: Not on file  Food Insecurity:   . Worried About in the Last Year: Not on file  . Ran Out of Food in the Last Year: Not on file  Transportation  Needs:   . Lack of Transportation (Medical): Not on file  . Lack of Transportation (Non-Medical): Not on file  Physical Activity:   . Days of Exercise per Week: Not on file  . Minutes of Exercise per Session: Not on file  Stress:   . Feeling of Stress : Not on file  Social Connections:   . Frequency of Communication with Friends and Family: Not on file  . Frequency of Social Gatherings with Friends and Family: Not on file  . Attends Religious Services: Not on file  . Active Member of Clubs or Organizations: Not on file  . Attends Banker Meetings: Not on file  . Marital  Status: Not on file   Family History: Family History  Problem Relation Age of Onset  . Cancer Mother   . Hypertension Mother   . Heart attack Mother   . Breast cancer Mother   . Ovarian cancer Mother   . Dementia Mother   . Stroke Mother   . Aneurysm Mother   . Heart failure Father   . Hypertension Father   . Heart attack Father   . Breast cancer Maternal Aunt   . Breast cancer Maternal Grandmother   . Ovarian cancer Maternal Grandmother    Allergies: Allergies  Allergen Reactions  . Iodine Swelling    Lips swelling Lips swelling   . Cardizem [Diltiazem]   . Ivp Dye [Iodinated Diagnostic Agents]    Medications: See med rec.  Review of Systems: See HPI for pertinent positives and negatives.   Objective:    General: Well Developed, well nourished, and in no acute distress.  Neuro: Alert and oriented x3.  HEENT: Normocephalic, atraumatic.  Skin: Warm and dry. Cardiac: Regular rate and rhythm, no murmurs rubs or gallops, no lower extremity edema.  Respiratory: Clear to auscultation bilaterally. Not using accessory muscles, speaking in full sentences.  Impression and Recommendations:    1. Essential hypertension Continue losartan 25 mg daily.  Blood pressure is at goal today.  Monitor blood pressures at home and work to limit dietary sodium and increase activity as travels permit. - losartan (COZAAR) 25 MG tablet; Take 1 tablet (25 mg total) by mouth daily.  2. Palpitations Stable, well managed with metoprolol 12.5 mg twice daily as needed.  3. Ear pressure, bilateral Continue with plan to see ENT tomorrow.  Hopefully they will be able to find a cause of the symptoms and address them for a full resolution.  4. Urinary incontinence, unspecified type Resolved with urethral sling in place.  5. Vaginal atrophy Managed by OB/GYN.  Doing much better on Osphena.  6. Depression with anxiety Continue Wellbutrin 150 mg daily.  Okay to try weaning off if desired but also  okay to continue.  Advised patient that if she does decide to wean off, she should take a tablet every other day for 2 to 3 weeks then decrease it to every third day for a couple of weeks before stopping it completely.  Patient verbalized understanding of the taper and will let me know if she decides to stop the medication.  Return in about 6 months (around 07/19/2020) for HTN follow up. ___________________________________________ Thayer Ohm, DNP, APRN, FNP-BC Primary Care and Sports Medicine [facility]

## 2020-02-11 ENCOUNTER — Other Ambulatory Visit: Payer: Self-pay | Admitting: Medical-Surgical

## 2020-02-11 DIAGNOSIS — R635 Abnormal weight gain: Secondary | ICD-10-CM

## 2020-02-11 DIAGNOSIS — F418 Other specified anxiety disorders: Secondary | ICD-10-CM

## 2020-03-03 ENCOUNTER — Encounter: Payer: Self-pay | Admitting: Medical-Surgical

## 2020-03-03 MED ORDER — OMEPRAZOLE 40 MG PO CPDR: 40.0000 mg | DELAYED_RELEASE_CAPSULE | Freq: Every day | ORAL | 3 refills | Status: DC

## 2020-03-31 ENCOUNTER — Encounter: Payer: Self-pay | Admitting: Medical-Surgical

## 2020-03-31 NOTE — Telephone Encounter (Signed)
Looks like she hasn't had labs in awhile, didn't know if you wanted to see her first?

## 2020-04-07 ENCOUNTER — Other Ambulatory Visit: Payer: Self-pay | Admitting: Medical-Surgical

## 2020-04-07 ENCOUNTER — Encounter: Payer: Self-pay | Admitting: Medical-Surgical

## 2020-04-07 ENCOUNTER — Ambulatory Visit (INDEPENDENT_AMBULATORY_CARE_PROVIDER_SITE_OTHER): Payer: 59 | Admitting: Medical-Surgical

## 2020-04-07 ENCOUNTER — Other Ambulatory Visit: Payer: Self-pay

## 2020-04-07 VITALS — BP 141/80 | HR 63 | Temp 98.2°F | Ht 66.0 in | Wt 161.6 lb

## 2020-04-07 DIAGNOSIS — L659 Nonscarring hair loss, unspecified: Secondary | ICD-10-CM

## 2020-04-07 DIAGNOSIS — R635 Abnormal weight gain: Secondary | ICD-10-CM

## 2020-04-07 DIAGNOSIS — R5383 Other fatigue: Secondary | ICD-10-CM | POA: Diagnosis not present

## 2020-04-07 MED ORDER — BACLOFEN 10 MG PO TABS: ORAL_TABLET | ORAL | 1 refills | Status: DC

## 2020-04-07 NOTE — Progress Notes (Signed)
Subjective:    CC: possible endocrine problem  HPI: Pleasant 60 year old female presenting for evaluation of a possible endocrine issue. Has noted that she has regained the weight she lost prior to her wedding. Is having night sweats as well as hair loss, decreased libido, and increased anxiety/intrusive thoughts. Feels that her life is great since she got married. Has been traveling with her husband and making weekend trips to various places. Eating a healthy diet and following a Mediterranean eating plan after doing a keto type cleans for the month of January. Even eating small portions and cutting out "whites" she did not lose any weight during the cleans. She eats 3 small meals per day on average and has an occasional piece of fruit in the afternoon for a snack. Staying activity with walking 2 miles daily and some home strength training. Drinking lots of water. Sleeping ok with the use of nightly magnesium.   I reviewed the past medical history, family history, social history, surgical history, and allergies today and no changes were needed.  Please see the problem list section below in epic for further details.  Past Medical History: Past Medical History:  Diagnosis Date  . GERD (gastroesophageal reflux disease)   . Hypertension   . Migraines    Past Surgical History: Past Surgical History:  Procedure Laterality Date  . ABDOMINAL HYSTERECTOMY    . ABDOMINAL SURGERY    . BREAST ENHANCEMENT SURGERY    . TONSILLECTOMY    . VAGINAL HYSTERECTOMY     Social History: Social History   Socioeconomic History  . Marital status: Single    Spouse name: Not on file  . Number of children: 3  . Years of education: Not on file  . Highest education level: Not on file  Occupational History  . Not on file  Tobacco Use  . Smoking status: Former Games developer  . Smokeless tobacco: Never Used  Vaping Use  . Vaping Use: Never used  Substance and Sexual Activity  . Alcohol use: Yes    Alcohol/week:  3.0 standard drinks    Types: 3 Glasses of wine per week    Comment: socially  . Drug use: Never  . Sexual activity: Yes    Birth control/protection: Surgical  Other Topics Concern  . Not on file  Social History Narrative  . Not on file   Social Determinants of Health   Financial Resource Strain: Not on file  Food Insecurity: Not on file  Transportation Needs: Not on file  Physical Activity: Not on file  Stress: Not on file  Social Connections: Not on file   Family History: Family History  Problem Relation Age of Onset  . Cancer Mother   . Hypertension Mother   . Heart attack Mother   . Breast cancer Mother   . Ovarian cancer Mother   . Dementia Mother   . Stroke Mother   . Aneurysm Mother   . Heart failure Father   . Hypertension Father   . Heart attack Father   . Breast cancer Maternal Aunt   . Breast cancer Maternal Grandmother   . Ovarian cancer Maternal Grandmother    Allergies: Allergies  Allergen Reactions  . Cardizem [Diltiazem]   . Iodine Swelling    Lips swelling  . Ivp Dye [Iodinated Diagnostic Agents]    Medications: See med rec.  Review of Systems: See HPI for pertinent positives and negatives.   Objective:    General: Well Developed, well nourished, and in no acute  distress.  Neuro: Alert and oriented x3.  HEENT: Normocephalic, atraumatic.  Skin: Warm and dry. Cardiac: Regular rate and rhythm, no murmurs rubs or gallops, no lower extremity edema.  Respiratory: Clear to auscultation bilaterally. Not using accessory muscles, speaking in full sentences.  Impression and Recommendations:    1. Weight gain/hair loss/fatigue Unclear etiology for symptoms.  She has been in menopause for a number of years so these new changes do not seem to be related although this cannot be ruled out at this point.  Checking labs as below.  Recommend continuing a healthy diet.  Suspect that diet and activity during travel were likely not as well regulated as before  her wedding which led to some weight gain. - Thyroid Panel With TSH - VITAMIN D 25 Hydroxy (Vit-D Deficiency, Fractures) - CBC with Differential/Platelet - Hemoglobin A1c - COMPLETE METABOLIC PANEL WITH GFR  Return in about 4 weeks (around 05/05/2020), or if symptoms worsen or fail to improve, for mood follow up.  ___________________________________________ Thayer Ohm, DNP, APRN, FNP-BC Primary Care and Sports Medicine [facility]

## 2020-04-08 LAB — THYROID PANEL WITH TSH
Free Thyroxine Index: 2.5 (ref 1.4–3.8)
T3 Uptake: 32 % (ref 22–35)
T4, Total: 7.7 ug/dL (ref 5.1–11.9)
TSH: 2.23 mIU/L (ref 0.40–4.50)

## 2020-04-08 LAB — COMPLETE METABOLIC PANEL WITH GFR
AG Ratio: 1.8 (calc) (ref 1.0–2.5)
ALT: 17 U/L (ref 6–29)
AST: 15 U/L (ref 10–35)
Albumin: 4.2 g/dL (ref 3.6–5.1)
Alkaline phosphatase (APISO): 59 U/L (ref 37–153)
BUN: 18 mg/dL (ref 7–25)
CO2: 28 mmol/L (ref 20–32)
Calcium: 9.7 mg/dL (ref 8.6–10.4)
Chloride: 106 mmol/L (ref 98–110)
Creat: 1 mg/dL (ref 0.50–1.05)
GFR, Est African American: 72 mL/min/{1.73_m2} (ref >=60)
GFR, Est Non African American: 62 mL/min/{1.73_m2} (ref >=60)
Globulin: 2.3 g/dL (calc) (ref 1.9–3.7)
Glucose, Bld: 91 mg/dL (ref 65–99)
Potassium: 4.7 mmol/L (ref 3.5–5.3)
Sodium: 140 mmol/L (ref 135–146)
Total Bilirubin: 0.3 mg/dL (ref 0.2–1.2)
Total Protein: 6.5 g/dL (ref 6.1–8.1)

## 2020-04-08 LAB — CBC WITH DIFFERENTIAL/PLATELET
Absolute Monocytes: 650 cells/uL (ref 200–950)
Basophils Absolute: 59 cells/uL (ref 0–200)
Basophils Relative: 0.9 %
Eosinophils Absolute: 247 cells/uL (ref 15–500)
Eosinophils Relative: 3.8 %
HCT: 39.4 % (ref 35.0–45.0)
Hemoglobin: 13.3 g/dL (ref 11.7–15.5)
Lymphs Abs: 1866 cells/uL (ref 850–3900)
MCH: 30.9 pg (ref 27.0–33.0)
MCHC: 33.8 g/dL (ref 32.0–36.0)
MCV: 91.6 fL (ref 80.0–100.0)
MPV: 10.8 fL (ref 7.5–12.5)
Monocytes Relative: 10 %
Neutro Abs: 3679 cells/uL (ref 1500–7800)
Neutrophils Relative %: 56.6 %
Platelets: 333 10*3/uL (ref 140–400)
RBC: 4.3 10*6/uL (ref 3.80–5.10)
RDW: 12.2 % (ref 11.0–15.0)
Total Lymphocyte: 28.7 %
WBC: 6.5 10*3/uL (ref 3.8–10.8)

## 2020-04-08 LAB — HEMOGLOBIN A1C
Hgb A1c MFr Bld: 5.1 % of total Hgb (ref <5.7)
Mean Plasma Glucose: 100 mg/dL
eAG (mmol/L): 5.5 mmol/L

## 2020-04-08 LAB — VITAMIN D 25 HYDROXY (VIT D DEFICIENCY, FRACTURES): Vit D, 25-Hydroxy: 67 ng/mL (ref 30–100)

## 2020-04-11 ENCOUNTER — Other Ambulatory Visit: Payer: Self-pay | Admitting: Medical-Surgical

## 2020-04-21 ENCOUNTER — Encounter: Payer: Self-pay | Admitting: Medical-Surgical

## 2020-05-17 ENCOUNTER — Encounter: Payer: Self-pay | Admitting: Medical-Surgical

## 2020-05-18 ENCOUNTER — Other Ambulatory Visit: Payer: Self-pay | Admitting: Medical-Surgical

## 2020-05-18 MED ORDER — SAXENDA 18 MG/3ML ~~LOC~~ SOPN: PEN_INJECTOR | SUBCUTANEOUS | 0 refills | Status: DC

## 2020-05-18 NOTE — Progress Notes (Signed)
Patient verified that Nancy Bautista is covered by her insurance and would like to start the medication to help lose the remaining 10-15 lbs.   Thayer Ohm, DNP, APRN, FNP-BC [facility]

## 2020-05-19 ENCOUNTER — Other Ambulatory Visit: Payer: Self-pay

## 2020-05-19 MED ORDER — BD PEN NEEDLE NANO U/F 32G X 4 MM MISC: 1.0000 | Freq: Every day | 0 refills | Status: DC

## 2020-06-27 ENCOUNTER — Telehealth (INDEPENDENT_AMBULATORY_CARE_PROVIDER_SITE_OTHER): Payer: 59 | Admitting: Medical-Surgical

## 2020-06-27 ENCOUNTER — Encounter: Payer: Self-pay | Admitting: Medical-Surgical

## 2020-06-27 DIAGNOSIS — U071 COVID-19: Secondary | ICD-10-CM

## 2020-06-27 MED ORDER — NIRMATRELVIR/RITONAVIR (PAXLOVID)TABLET: 3.0000 | ORAL_TABLET | Freq: Two times a day (BID) | ORAL | 0 refills | Status: AC

## 2020-06-27 NOTE — Progress Notes (Signed)
Virtual Visit via Video Note  I connected with Nancy Bautista on 06/27/20 at  1:20 PM EDT by a video enabled telemedicine application and verified that I am speaking with the correct person using two identifiers.   I discussed the limitations of evaluation and management by telemedicine and the availability of in person appointments. The patient expressed understanding and agreed to proceed.  Patient location: home Provider locations: office  Subjective:    CC: COVID-positive  HPI: Pleasant 60 year old female presenting via MyChart video visit with reports of testing positive for COVID.  She and her husband have been traveling frequently and he recently went to [place].  She picked him up from the airport several days ago.  He began to develop symptoms and tested positive.  Last night, she had a scratchy throat so she did a COVID test which was positive.  She tested again this morning to make sure and it was again positive.  Notes that she has a sore throat, fatigue, body aches, nonproductive cough, burning in her chest, and subjective fevers.  She does not have a thermometer on hand to check her temperature since they are in a camper at this point.  Eating and drinking without difficulty.  No nausea and vomiting but did have some diarrhea this morning.  Denies chest pain, shortness of breath, and loss of taste/smell.  Has been taking DayQuil/NyQuil and Advil which do somewhat help with the symptoms but only temporarily.  Past medical history, Surgical history, Family history not pertinant except as noted below, Social history, Allergies, and medications have been entered into the medical record, reviewed, and corrections made.   Review of Systems: See HPI for pertinent positives and negatives.   Objective:    General: Speaking clearly in complete sentences without any shortness of breath.  Alert and oriented x3.  Normal judgment. No apparent acute distress.  Impression and  Recommendations:    1. COVID-19 virus infection Discussed symptomatic care recommendations with over the counter medications. Sending in Paxlovid BID x 5 days. Monitor for shortness of breath, chest pain, and high fevers that don't respond to antipyretics. Quarantine per the current CDC recommendations.  I discussed the assessment and treatment plan with the patient. The patient was provided an opportunity to ask questions and all were answered. The patient agreed with the plan and demonstrated an understanding of the instructions.   The patient was advised to call back or seek an in-person evaluation if the symptoms worsen or if the condition fails to improve as anticipated.  20 minutes of non-face-to-face time was provided during this encounter.  Return if symptoms worsen or fail to improve.  Thayer Ohm, DNP, APRN, FNP-BC [facility]

## 2020-06-27 NOTE — Patient Instructions (Signed)
https://www.fda.gov/media/155055/download

## 2020-07-11 ENCOUNTER — Encounter: Payer: Self-pay | Admitting: Medical-Surgical

## 2020-07-19 ENCOUNTER — Ambulatory Visit: Payer: 59 | Admitting: Medical-Surgical

## 2020-08-03 ENCOUNTER — Encounter: Payer: Self-pay | Admitting: Medical-Surgical

## 2020-08-03 ENCOUNTER — Ambulatory Visit (INDEPENDENT_AMBULATORY_CARE_PROVIDER_SITE_OTHER): Payer: 59

## 2020-08-03 ENCOUNTER — Other Ambulatory Visit: Payer: Self-pay

## 2020-08-03 ENCOUNTER — Ambulatory Visit (INDEPENDENT_AMBULATORY_CARE_PROVIDER_SITE_OTHER): Payer: 59 | Admitting: Medical-Surgical

## 2020-08-03 VITALS — BP 130/77 | HR 54 | Temp 98.5°F | Ht 66.0 in | Wt 161.4 lb

## 2020-08-03 DIAGNOSIS — R059 Cough, unspecified: Secondary | ICD-10-CM | POA: Diagnosis not present

## 2020-08-03 DIAGNOSIS — R0602 Shortness of breath: Secondary | ICD-10-CM

## 2020-08-03 DIAGNOSIS — Z8616 Personal history of COVID-19: Secondary | ICD-10-CM | POA: Diagnosis not present

## 2020-08-03 DIAGNOSIS — F418 Other specified anxiety disorders: Secondary | ICD-10-CM | POA: Diagnosis not present

## 2020-08-03 DIAGNOSIS — J329 Chronic sinusitis, unspecified: Secondary | ICD-10-CM

## 2020-08-03 DIAGNOSIS — R635 Abnormal weight gain: Secondary | ICD-10-CM | POA: Diagnosis not present

## 2020-08-03 DIAGNOSIS — I10 Essential (primary) hypertension: Secondary | ICD-10-CM

## 2020-08-03 DIAGNOSIS — J40 Bronchitis, not specified as acute or chronic: Secondary | ICD-10-CM

## 2020-08-03 MED ORDER — BUPROPION HCL ER (XL) 150 MG PO TB24: ORAL_TABLET | ORAL | 1 refills | Status: DC

## 2020-08-03 MED ORDER — FLUCONAZOLE 150 MG PO TABS: 150.0000 mg | ORAL_TABLET | Freq: Once | ORAL | 0 refills | Status: AC

## 2020-08-03 MED ORDER — AZITHROMYCIN 250 MG PO TABS: ORAL_TABLET | ORAL | 0 refills | Status: AC

## 2020-08-03 MED ORDER — LOSARTAN POTASSIUM 25 MG PO TABS: 25.0000 mg | ORAL_TABLET | Freq: Every day | ORAL | 1 refills | Status: DC

## 2020-08-03 NOTE — Progress Notes (Signed)
Subjective:    CC: HTN follow up  HPI: Pleasant 60 year old female presenting for follow up on HTN. Since her last visit here for COVID, she has recovered from her illness but was reinfected with COVID while in [place]. Still having issues with PND, cough, shortness of breath, and right ear pressure. Otherwise, has been doing well. Her mood has been good and she is tolerating the Wellbutrin well without side effects. Her blood pressure has been doing well on Losartan 25mg  daily. She is taking Metoprolol 12.5 daily as needed for palpitations. Using Baclofen prn for headaches but has not need this recently. Denies CP, lower extremity edema, dizziness, headaches, or vision changes.  I reviewed the past medical history, family history, social history, surgical history, and allergies today and no changes were needed.  Please see the problem list section below in epic for further details.  Past Medical History: Past Medical History:  Diagnosis Date   GERD (gastroesophageal reflux disease)    Hypertension    Migraines    Past Surgical History: Past Surgical History:  Procedure Laterality Date   ABDOMINAL HYSTERECTOMY     ABDOMINAL SURGERY     BREAST ENHANCEMENT SURGERY     TONSILLECTOMY     VAGINAL HYSTERECTOMY     Social History: Social History   Socioeconomic History   Marital status: Single    Spouse name: Not on file   Number of children: 3   Years of education: Not on file   Highest education level: Not on file  Occupational History   Not on file  Tobacco Use   Smoking status: Former    Pack years: 0.00   Smokeless tobacco: Never  Vaping Use   Vaping Use: Never used  Substance and Sexual Activity   Alcohol use: Yes    Alcohol/week: 3.0 standard drinks    Types: 3 Glasses of wine per week    Comment: socially   Drug use: Never   Sexual activity: Yes    Birth control/protection: Surgical  Other Topics Concern   Not on file  Social History Narrative   Not on file    Social Determinants of Health   Financial Resource Strain: Not on file  Food Insecurity: Not on file  Transportation Needs: Not on file  Physical Activity: Not on file  Stress: Not on file  Social Connections: Not on file   Family History: Family History  Problem Relation Age of Onset   Cancer Mother    Hypertension Mother    Heart attack Mother    Breast cancer Mother    Ovarian cancer Mother    Dementia Mother    Stroke Mother    Aneurysm Mother    Heart failure Father    Hypertension Father    Heart attack Father    Breast cancer Maternal Aunt    Breast cancer Maternal Grandmother    Ovarian cancer Maternal Grandmother    Allergies: Allergies  Allergen Reactions   Cardizem [Diltiazem]    Iodine Swelling    Lips swelling   Ivp Dye [Iodinated Diagnostic Agents]    Medications: See med rec.  Review of Systems: See HPI for pertinent positives and negatives.   Objective:    General: Well Developed, well nourished, and in no acute distress.  Neuro: Alert and oriented x3.  HEENT: Normocephalic, atraumatic, pupils equal round reactive to light, neck supple, no masses, no lymphadenopathy, thyroid nonpalpable. Middle ear effusion to the right ear, normal TM to the left ear.  Skin: Warm and dry. Cardiac: Regular rate and rhythm, no murmurs rubs or gallops, no lower extremity edema.  Respiratory: Clear to auscultation bilaterally. Not using accessory muscles, speaking in full sentences.   Impression and Recommendations:    1. Essential hypertension BP at goal today. Continue losartan 25mg  daily.  - losartan (COZAAR) 25 MG tablet; Take 1 tablet (25 mg total) by mouth daily.  Dispense: 90 tablet; Refill: 1  2. Weight gain Continue Wellbutrin 150mg  daily as prescribed.  - buPROPion (WELLBUTRIN XL) 150 MG 24 hr tablet; TAKE 1 TABLET BY MOUTH EVERY DAY IN THE MORNING  Dispense: 90 tablet; Refill: 1  3. Depression with anxiety Continue Wellbutrin 150mg  daily as  prescribed.  - buPROPion (WELLBUTRIN XL) 150 MG 24 hr tablet; TAKE 1 TABLET BY MOUTH EVERY DAY IN THE MORNING  Dispense: 90 tablet; Refill: 1  4. Cough 5. Shortness of breath Getting chest x-ray today.  - DG Chest 2 View; Future  6. Sinobronchitis Start Azithromycin 500mg  today then 250mg  daily for 4 days. Diflucan provided for vulvovaginal candidiasis prophylaxis.   Return in about 6 months (around 02/02/2021) for HTN follow up. __________________________________________ , DNP, APRN, FNP-BC Primary Care and Sports Medicine [facility] [place]

## 2020-08-04 NOTE — Progress Notes (Signed)
Chest x-ray looks good with no areas of concern despite continued cough.

## 2020-08-24 ENCOUNTER — Encounter: Payer: Self-pay | Admitting: Medical-Surgical

## 2020-08-25 ENCOUNTER — Telehealth (INDEPENDENT_AMBULATORY_CARE_PROVIDER_SITE_OTHER): Payer: 59 | Admitting: Medical-Surgical

## 2020-08-25 ENCOUNTER — Encounter: Payer: Self-pay | Admitting: Medical-Surgical

## 2020-08-25 ENCOUNTER — Other Ambulatory Visit: Payer: Self-pay

## 2020-08-25 DIAGNOSIS — M533 Sacrococcygeal disorders, not elsewhere classified: Secondary | ICD-10-CM

## 2020-08-25 MED ORDER — CYCLOBENZAPRINE HCL 10 MG PO TABS: 10.0000 mg | ORAL_TABLET | Freq: Three times a day (TID) | ORAL | 0 refills | Status: DC | PRN

## 2020-08-25 MED ORDER — MELOXICAM 15 MG PO TABS: 15.0000 mg | ORAL_TABLET | Freq: Every day | ORAL | 0 refills | Status: DC

## 2020-08-25 MED ORDER — PREDNISONE 50 MG PO TABS: 50.0000 mg | ORAL_TABLET | Freq: Every day | ORAL | 0 refills | Status: DC

## 2020-08-25 NOTE — Progress Notes (Signed)
Virtual Visit via Telephone   I connected with  Nancy Bautista  on 08/25/20 by telephone/telehealth and verified that I am speaking with the correct person using two identifiers.   I discussed the limitations, risks, security and privacy concerns of performing an evaluation and management service by telephone, including the higher likelihood of inaccurate diagnosis and treatment, and the availability of in person appointments.  We also discussed the likely need of an additional face to face encounter for complete and high quality delivery of care.  I also discussed with the patient that there may be a patient responsible charge related to this service. The patient expressed understanding and wishes to proceed.  Provider location is in medical facility. Patient location is at their home, different from provider location. People involved in care of the patient during this telehealth encounter were myself, my nurse/medical assistant, and my front office/scheduling team member.  CC: Back pain  HPI: Very pleasant 60 year old female presenting via telephone to discuss back pain.  She is unable to connect via MyChart video visit as she is driving at the time of our appointment.  Over the last 3 weeks, she has had a flare of her chronic back pain issues.  She does have some degenerative changes in her lower spine and has intermittent flares but these are usually managed with conservative measures.  This flare seems to have been a bit different.  She does go to her chiropractor who has been doing acupuncture, stretching, TENS, etc.  After evaluation and discussion with him, she was told that she has SI joint dysfunction and that is causing her discomfort.  She is not having sciatica although she does have occasional twinges in her foot.  Notes that the pain is worse when bending forward and best when lying flat.  Has used Epsom salt baths, cupping, and Salonpas.  Has also been taking ibuprofen 800 mg 2-3  times daily.  Pain is now interfering with her ability to function and complete her regular daily activities.  Review of Systems: See HPI for pertinent positives and negatives.   Objective Findings:    General: Speaking full sentences, no audible heavy breathing.  Sounds alert and appropriately interactive.    Impression and Recommendations:    1. SI (sacroiliac) joint dysfunction Prednisone 50 mg daily x5 days.  Flexeril 10 mg 3 times daily as needed.  After prednisone is complete, start meloxicam 15 mg daily.  Referring for formal physical therapy, preferably at a [place] location.  Okay to continue chiropractic care as instructed.  I discussed the above assessment and treatment plan with the patient. The patient was provided an opportunity to ask questions and all were answered. The patient agreed with the plan and demonstrated an understanding of the instructions.   The patient was advised to call back or seek an in-person evaluation if the symptoms worsen or if the condition fails to improve as anticipated.  15 minutes of non-face-to-face time was provided during this encounter.  Return if symptoms worsen or fail to improve. ___________________________________________ Christen Butter, DNP, APRN, FNP-BC Primary Care and Sports Medicine [facility] [place]

## 2020-08-25 NOTE — Telephone Encounter (Signed)
Pt had a VV with Joy this morning.

## 2020-08-26 ENCOUNTER — Telehealth: Payer: 59 | Admitting: Medical-Surgical

## 2020-09-19 ENCOUNTER — Encounter: Payer: Self-pay | Admitting: Medical-Surgical

## 2020-09-19 ENCOUNTER — Other Ambulatory Visit: Payer: Self-pay | Admitting: Medical-Surgical

## 2020-09-19 DIAGNOSIS — M545 Low back pain, unspecified: Secondary | ICD-10-CM

## 2020-09-21 ENCOUNTER — Other Ambulatory Visit: Payer: Self-pay | Admitting: Medical-Surgical

## 2020-09-24 ENCOUNTER — Other Ambulatory Visit: Payer: Self-pay

## 2020-09-24 ENCOUNTER — Ambulatory Visit (INDEPENDENT_AMBULATORY_CARE_PROVIDER_SITE_OTHER): Payer: 59

## 2020-09-24 DIAGNOSIS — M545 Low back pain, unspecified: Secondary | ICD-10-CM | POA: Diagnosis not present

## 2020-09-26 ENCOUNTER — Other Ambulatory Visit: Payer: Self-pay | Admitting: Medical-Surgical

## 2020-09-26 ENCOUNTER — Encounter: Payer: Self-pay | Admitting: Medical-Surgical

## 2020-09-26 DIAGNOSIS — M545 Low back pain, unspecified: Secondary | ICD-10-CM

## 2020-09-26 DIAGNOSIS — M533 Sacrococcygeal disorders, not elsewhere classified: Secondary | ICD-10-CM

## 2020-09-26 NOTE — Progress Notes (Signed)
HPI: FU palpitations.  Stress echocardiogram 2017 normal. Calcium score 2017 19. Patient states she has a history of mitral valve prolapse.  She has had intermittent palpitations for years.  There is some improvement with metoprolol.  Echocardiogram June 2021 showed normal LV function.  Since last seen, she has dyspnea with more vigorous activities but not routine activities.  No orthopnea, PND, pedal edema, exertional chest pain or syncope.  She continues to have occasional brief palpitations that are longstanding.  Current Outpatient Medications  Medication Sig Dispense Refill   ALPRAZolam (XANAX) 0.25 MG tablet Take 1 tablet (0.25 mg total) by mouth daily as needed for anxiety (related to flying). 10 tablet 0   buPROPion (WELLBUTRIN XL) 150 MG 24 hr tablet TAKE 1 TABLET BY MOUTH EVERY DAY IN THE MORNING 90 tablet 1   losartan (COZAAR) 25 MG tablet Take 1 tablet (25 mg total) by mouth daily. 90 tablet 1   meloxicam (MOBIC) 15 MG tablet TAKE 1 TABLET (15 MG TOTAL) BY MOUTH DAILY. 30 tablet 0   metoprolol tartrate (LOPRESSOR) 25 MG tablet Take 0.5 tablets (12.5 mg total) by mouth 2 (two) times daily as needed. 30 tablet 2   omeprazole (PRILOSEC) 40 MG capsule Take 1 capsule (40 mg total) by mouth daily. 90 capsule 3   VITAMIN D PO Take 2,000 Int'l Units by mouth daily.     cyclobenzaprine (FLEXERIL) 10 MG tablet Take 1 tablet (10 mg total) by mouth 3 (three) times daily as needed for muscle spasms. (Patient not taking: Reported on 10/05/2020) 30 tablet 0   No current facility-administered medications for this visit.     Past Medical History:  Diagnosis Date   GERD (gastroesophageal reflux disease)    Hypertension    Migraines     Past Surgical History:  Procedure Laterality Date   ABDOMINAL HYSTERECTOMY     ABDOMINAL SURGERY     BREAST ENHANCEMENT SURGERY     TONSILLECTOMY     VAGINAL HYSTERECTOMY      Social History   Socioeconomic History   Marital status: Married     Spouse name: Not on file   Number of children: 3   Years of education: Not on file   Highest education level: Not on file  Occupational History   Not on file  Tobacco Use   Smoking status: Former   Smokeless tobacco: Never  Vaping Use   Vaping Use: Never used  Substance and Sexual Activity   Alcohol use: Yes    Alcohol/week: 3.0 standard drinks    Types: 3 Glasses of wine per week    Comment: socially   Drug use: Never   Sexual activity: Yes    Birth control/protection: Surgical  Other Topics Concern   Not on file  Social History Narrative   Not on file   Social Determinants of Health   Financial Resource Strain: Not on file  Food Insecurity: Not on file  Transportation Needs: Not on file  Physical Activity: Not on file  Stress: Not on file  Social Connections: Not on file  Intimate Partner Violence: Not on file    Family History  Problem Relation Age of Onset   Cancer Mother    Hypertension Mother    Heart attack Mother    Breast cancer Mother    Ovarian cancer Mother    Dementia Mother    Stroke Mother    Aneurysm Mother    Heart failure Father  Hypertension Father    Heart attack Father    Breast cancer Maternal Aunt    Breast cancer Maternal Grandmother    Ovarian cancer Maternal Grandmother     ROS: no fevers or chills, productive cough, hemoptysis, dysphasia, odynophagia, melena, hematochezia, dysuria, hematuria, rash, seizure activity, orthopnea, PND, pedal edema, claudication. Remaining systems are negative.  Physical Exam: Well-developed well-nourished in no acute distress.  Skin is warm and dry.  HEENT is normal.  Neck is supple.  Chest is clear to auscultation with normal expansion.  Cardiovascular exam is regular rate and rhythm.  Abdominal exam nontender or distended. No masses palpated. Extremities show no edema. neuro grossly intact  ECG-normal sinus rhythm at a rate of 61, no ST changes.  Personally reviewed  A/P  1  palpitations-symptoms are reasonably well controlled.  She asked if she could discontinue metoprolol.  I think that would be reasonable and if her symptoms worsen she can take it as needed or resume at present dose.  2 hypertension-blood pressure controlled.  Continue present medical regimen and follow.  3 question history of mitral valve prolapse-not evident on most recent echocardiogram.  Olga Millers, MD

## 2020-09-27 ENCOUNTER — Ambulatory Visit (INDEPENDENT_AMBULATORY_CARE_PROVIDER_SITE_OTHER): Payer: 59 | Admitting: Physical Therapy

## 2020-09-27 ENCOUNTER — Encounter: Payer: Self-pay | Admitting: Physical Therapy

## 2020-09-27 ENCOUNTER — Other Ambulatory Visit: Payer: Self-pay

## 2020-09-27 DIAGNOSIS — R29898 Other symptoms and signs involving the musculoskeletal system: Secondary | ICD-10-CM | POA: Diagnosis not present

## 2020-09-27 DIAGNOSIS — M545 Low back pain, unspecified: Secondary | ICD-10-CM | POA: Diagnosis not present

## 2020-09-27 DIAGNOSIS — M6281 Muscle weakness (generalized): Secondary | ICD-10-CM

## 2020-09-27 NOTE — Patient Instructions (Signed)
Access Code: [email or web address] URL: [email or web address] Date: 09/27/2020 Prepared by: Raynelle Fanning  Exercises Prone Press Up - 3-4 x daily - 7 x weekly - 1-3 sets - 10 reps Supine Transversus Abdominis Bracing - Hands on Stomach - 3 x daily - 7 x weekly - 1 sets - 10 reps - 5 sec hold

## 2020-09-27 NOTE — Therapy (Signed)
[facility] [place] [address] [place], [place], [address] Phone: [phone]   Fax:  [phone]  Physical Therapy Evaluation  Patient Details  Name: Nancy Bautista MRN: [id number] Date of Birth: 10/25/1960 Referring Provider (PT): Christen Butter, [facility]   Encounter Date: 09/27/2020   PT End of Session - 09/27/20 0800     Visit Number 1    Number of Visits 12    Date for PT Re-Evaluation 11/08/20    Authorization Type aetna    PT Start Time 0800    PT Stop Time 0858    PT Time Calculation (min) 58 min    Activity Tolerance Patient tolerated treatment well;Patient limited by pain    Behavior During Therapy [facility] for tasks assessed/performed             Past Medical History:  Diagnosis Date   GERD (gastroesophageal reflux disease)    Hypertension    Migraines     Past Surgical History:  Procedure Laterality Date   ABDOMINAL HYSTERECTOMY     ABDOMINAL SURGERY     BREAST ENHANCEMENT SURGERY     TONSILLECTOMY     VAGINAL HYSTERECTOMY      There were no vitals filed for this visit.    Subjective Assessment - 09/27/20 0803     Subjective Third week in June, after emptying car sat down and felt something. Saw chiro for 4 weeks which eased it up. Rode to [place] for weekend in early Aug and on Monday really bad. Pain is constant except when lying. Sittin worse than standing. Pain is right across her low back and feels like she has cramps in the front. Legs get heavy after a while. Standing better than sitting.    Pertinent History HTN, TOS, osteopenia    How long can you sit comfortably? 5 min    How long can you walk comfortably? if hits anything uneven, very bad.; Stairs bad.    Diagnostic tests MRI neg    Patient Stated Goals get rid of pain    Currently in Pain? Yes    Pain Score 9     Pain Location Back    Pain Orientation Right;Left;Lower    Pain Descriptors / Indicators Aching;Dull;Sharp    Pain Type Acute  pain    Pain Radiating Towards dull ache into Rt leg and into calf when lying down    Pain Onset More than a month ago    Pain Frequency Constant    Aggravating Factors  sitting, walking, stairs    Pain Relieving Factors lying down    Effect of Pain on Daily Activities all ADLs affected                [facility] PT Assessment - 09/27/20 0001       Assessment   Medical Diagnosis SIJ dysfunction; acute LBP without sciatica    Referring Provider (PT) Christen Butter, [facility]    Onset Date/Surgical Date 07/13/20    Hand Dominance Right    Next MD Visit sees Dr. Karie Schwalbe tomorrow    Prior Therapy no      Precautions   Precautions None      Restrictions   Weight Bearing Restrictions No      Balance Screen   Has the patient fallen in the past 6 months No    Has the patient had a decrease in activity level because of a fear of falling?  No    Is the patient reluctant to  leave their home because of a fear of falling?  No      Home [employer]   Living Environment Private residence    Additional Comments stairs at home; bedroom upstairs      Prior Function   Level of Independence Independent      Observation/Other Assessments   Focus on Therapeutic Outcomes (FOTO)  FS = 10 (goal 40)      Posture/Postural Control   Posture/Postural Control No significant limitations      ROM / Strength   AROM / PROM / Strength AROM;Strength      AROM   Overall AROM Comments lumbar flex/ext about 10 deg; lumbar ext in prone WFL;  lateral flexion WFL; rot limited 50% bil      Strength   Overall Strength Comments BLE grossly 4+/5 limited by pain      Flexibility   Soft Tissue Assessment /Muscle Length yes    Hamstrings marked in sitting (pain in back)    Quadriceps prone testing: pain in back bil Rt>Lt    Piriformis WNL    Quadratus Lumborum tight bil; tight psoas R>L      Palpation   Palpation comment marked right piriformis and along bil SIJ Rt >Lt; marked bil psoas Rt>Lt      Special Tests     Special Tests Hip Special Tests    Other special tests prone press ups 1x10, 1x5 relief at up position; positive paine with prone knee flexion bil Rt>Lt    Hip Special Tests  Luisa HartPatrick (FABER) Test;Anterior Hip Impingement Test      Luisa HartPatrick (FABER) Test   Findings Negative    Side Right;Left      Anterior Hip Impingement Test    Findings Negative    Side  Right;Left                        Objective measurements completed on examination: See above findings.       [facility] Adult PT Treatment/Exercise - 09/27/20 0001       Posture/Postural Control   Posture Comments even pelvic landmarks in standiing and supine      Modalities   Modalities Electrical Stimulation;Moist Heat      Moist Heat Therapy   Number Minutes Moist Heat 15 Minutes    Moist Heat Location Lumbar Spine      Electrical Stimulation   Electrical Stimulation Location lumbar/glutes    Electrical Stimulation Action IFC    Electrical Stimulation Parameters 15 min to tolerance    Electrical Stimulation Goals Pain;Tone      Manual Therapy   Manual Therapy Joint mobilization    Joint Mobilization gd II/III UPA mobs lumbar spine as part of assessment                    PT Education - 09/27/20 0958     Education Details HEP    Person(s) Educated Patient    Methods Explanation;Demonstration;Handout    Comprehension Verbalized understanding;Returned demonstration              PT Short Term Goals - 09/27/20 1012       PT SHORT TERM GOAL #1   Title STGs=LTGs               PT Long Term Goals - 09/27/20 1012       PT LONG TERM GOAL #1   Title Decrease pain in the back by >=80% allowing patient to walk and  participate in normal funcitonal activities with less pain and greater ease    Status New    Target Date 11/08/20      PT LONG TERM GOAL #2   Title Improve tissue extensibility and ROM through lumbar and hips to allow greater ease of movement and decreased pain    Time 6     Period Weeks    Status New      PT LONG TERM GOAL #3   Title Able to climb stairs without pain    Time 6    Period Weeks    Status New      PT LONG TERM GOAL #4   Title Able to perform sit to stand and bed mobility with self-reported minimal pain in the back.    Time 6    Period Weeks    Status New      PT LONG TERM GOAL #5   Title Improve FS score to 40    Baseline 10    Time 6    Period Weeks    Status New      Additional Long Term Goals   Additional Long Term Goals Yes      PT LONG TERM GOAL #6   Title Able to demonstrate correct body mechanics with squat, lift and carries of at least 10# and be independent in advanced HEP to prevent futher injury    Time 6    Period Weeks    Status New                    Plan - 09/27/20 1004     Clinical Impression Statement Patient presents with acute onset of LBP starting in June after unloading a car. She had some relief with chiropracty, but pain increased again about one week ago. She is limited in all ADLS and only gets relief lying down. Marked pain with transfers, stairs, ambulation. She has marked muscle spasms in lumbar and iliopsoas muscles right > left. MRI was negative except mild facet OA L4/5. No pain with PA mobs here. She has been seen in the clinic for her left hip in the past and reports intemittent LBP for years. Her lumbar ROM is limited in flex/ext and rotation. Hips are [facility]. She wil benefit from PT to address these deficits and return her to her PLOF.    Personal Factors and Comorbidities Comorbidity 3+    Comorbidities HTN, TOS, osteopenia    Examination-Activity Limitations Bathing;Bed Mobility;Bend;Dressing;Lift;Locomotion Level;Sit;Stairs;Stand;Transfers    Examination-Participation Restrictions Laundry;Meal Prep;Cleaning    Stability/Clinical Decision Making Stable/Uncomplicated    Clinical Decision Making Low    Rehab Potential Excellent    PT Frequency 2x / week    PT Duration 6 weeks    PT  Treatment/Interventions ADLs/Self Care Home Management;Aquatic Therapy;Cryotherapy;Electrical Stimulation;Moist Heat;Traction;Therapeutic exercise;Neuromuscular re-education;Therapeutic activities;Functional mobility training;Patient/family education;Manual techniques;Dry needling;Taping;Spinal Manipulations;Joint Manipulations    PT Next Visit Plan Work on calming down spasms (psoas, lumbar, QL); progress to core/stabilization, functional activities and body mechanics    PT Home Exercise Plan [id number]    Consulted and Agree with Plan of Care Patient             Patient will benefit from skilled therapeutic intervention in order to improve the following deficits and impairments:  Abnormal gait, Decreased range of motion, Increased muscle spasms, Decreased activity tolerance, Pain, Impaired flexibility, Decreased mobility, Decreased strength  Visit Diagnosis: Acute bilateral low back pain, unspecified whether sciatica present - Plan: CANCELED: PT plan  of care cert/re-cert  Other symptoms and signs involving the musculoskeletal system - Plan: CANCELED: PT plan of care cert/re-cert  Muscle weakness (generalized) - Plan: CANCELED: PT plan of care cert/re-cert     Problem List Patient Active Problem List   Diagnosis Date Noted   Primary osteoarthritis of left hip 09/14/2019   Palpitations 05/11/2019   Essential hypertension 05/11/2019   Heart murmur, systolic 05/11/2019   Urinary incontinence 05/11/2019   Weight gain 05/11/2019    Solon Palm PT 09/27/2020, 1:37 PM  [facility] [place] [address] [place], [place], [address] Phone: [phone]   Fax:  [phone]  Name: Nancy Bautista MRN: [id number] Date of Birth: 19-Sep-1960

## 2020-09-28 ENCOUNTER — Ambulatory Visit: Payer: 59 | Admitting: Sports Medicine

## 2020-09-30 ENCOUNTER — Other Ambulatory Visit: Payer: Self-pay

## 2020-09-30 ENCOUNTER — Ambulatory Visit (INDEPENDENT_AMBULATORY_CARE_PROVIDER_SITE_OTHER): Payer: 59 | Admitting: Physical Therapy

## 2020-09-30 DIAGNOSIS — R29898 Other symptoms and signs involving the musculoskeletal system: Secondary | ICD-10-CM

## 2020-09-30 DIAGNOSIS — M545 Low back pain, unspecified: Secondary | ICD-10-CM

## 2020-09-30 DIAGNOSIS — M6281 Muscle weakness (generalized): Secondary | ICD-10-CM

## 2020-09-30 DIAGNOSIS — M25552 Pain in left hip: Secondary | ICD-10-CM

## 2020-09-30 NOTE — Therapy (Signed)
[facility] [address] [place], [place], [address] Phone: [phone]   Fax:  [phone]  Physical Therapy Treatment  Patient Details  Name: Nancy Bautista MRN: [id number] Date of Birth: February 12, 1961 Referring Provider (PT): Christen Butter, [facility]   Encounter Date: 09/30/2020   PT End of Session - 09/30/20 1354     Visit Number 2    Number of Visits 12    Date for PT Re-Evaluation 11/08/20    Authorization Type aetna    PT Start Time 1317    PT Stop Time 1400    PT Time Calculation (min) 43 min    Activity Tolerance Patient tolerated treatment well    Behavior During Therapy [facility] for tasks assessed/performed             Past Medical History:  Diagnosis Date   GERD (gastroesophageal reflux disease)    Hypertension    Migraines     Past Surgical History:  Procedure Laterality Date   ABDOMINAL HYSTERECTOMY     ABDOMINAL SURGERY     BREAST ENHANCEMENT SURGERY     TONSILLECTOMY     VAGINAL HYSTERECTOMY      There were no vitals filed for this visit.   Subjective Assessment - 09/30/20 1323     Subjective Pt states she feels the worst after riding in the car, even short distances. States that HEP is getting better    Patient Stated Goals get rid of pain    Currently in Pain? Yes    Pain Score 7     Pain Location Back    Pain Orientation Lower    Pain Descriptors / Indicators Aching;Lambert Mody                [facility] PT Assessment - 09/30/20 0001       Assessment   Medical Diagnosis SIJ dysfunction; acute LBP without sciatica    Referring Provider (PT) Christen Butter, [facility]    Onset Date/Surgical Date 07/13/20    Hand Dominance Right                           [facility] Adult PT Treatment/Exercise - 09/30/20 0001       Exercises   Exercises Lumbar      Lumbar Exercises: Aerobic   Other Aerobic Exercise walking 3 laps around the gym for warm up with cues for posture      Lumbar Exercises:  Supine   Ab Set 10 reps;5 seconds    Clam 20 reps    Clam Limitations red TB      Lumbar Exercises: Sidelying   Clam Right;Left;15 reps      Lumbar Exercises: Prone   Other Prone Lumbar Exercises prone press up x 10      Moist Heat Therapy   Number Minutes Moist Heat 10 Minutes    Moist Heat Location Lumbar Spine      Electrical Stimulation   Electrical Stimulation Location lumbar/SIJ    Electrical Stimulation Action IFC    Electrical Stimulation Parameters to tolerance    Electrical Stimulation Goals Pain      Manual Therapy   Manual Therapy Passive ROM    Joint Mobilization grade 2-3 CPAs and UPAs SIJ and L4/5    Passive ROM single knee to chest stretch and hamstring stretch bilat 2 x 30 sec  PT Education - 09/30/20 1353     Education Details updated HEP    Person(s) Educated Patient    Methods Explanation;Demonstration;Handout    Comprehension Verbalized understanding;Returned demonstration              PT Short Term Goals - 09/27/20 1012       PT SHORT TERM GOAL #1   Title STGs=LTGs               PT Long Term Goals - 09/27/20 1012       PT LONG TERM GOAL #1   Title Decrease pain in the back by >=80% allowing patient to walk and participate in normal funcitonal activities with less pain and greater ease    Status New    Target Date 11/08/20      PT LONG TERM GOAL #2   Title Improve tissue extensibility and ROM through lumbar and hips to allow greater ease of movement and decreased pain    Time 6    Period Weeks    Status New      PT LONG TERM GOAL #3   Title Able to climb stairs without pain    Time 6    Period Weeks    Status New      PT LONG TERM GOAL #4   Title Able to perform sit to stand and bed mobility with self-reported minimal pain in the back.    Time 6    Period Weeks    Status New      PT LONG TERM GOAL #5   Title Improve FS score to 40    Baseline 10    Time 6    Period Weeks    Status New       Additional Long Term Goals   Additional Long Term Goals Yes      PT LONG TERM GOAL #6   Title Able to demonstrate correct body mechanics with squat, lift and carries of at least 10# and be independent in advanced HEP to prevent futher injury    Time 6    Period Weeks    Status New                   Plan - 09/30/20 1354     Clinical Impression Statement Pt continues with significant pain especially bridging and rolling. Pt able to perform log roll but sitll has pain. Pt with good tolerance to addition of lower trunk rotation and clam shell exercises. Manual performed to pt tolerance to improve mobility and decrease pain    PT Next Visit Plan decrease mm spasticity, core stabilization, stretching as tolerated    PT Home Exercise Plan [email or web address]    Consulted and Agree with Plan of Care Patient             Patient will benefit from skilled therapeutic intervention in order to improve the following deficits and impairments:     Visit Diagnosis: Acute bilateral low back pain, unspecified whether sciatica present  Other symptoms and signs involving the musculoskeletal system  Muscle weakness (generalized)  Pain in left hip     Problem List Patient Active Problem List   Diagnosis Date Noted   Primary osteoarthritis of left hip 09/14/2019   Palpitations 05/11/2019   Essential hypertension 05/11/2019   Heart murmur, systolic 05/11/2019   Urinary incontinence 05/11/2019   Weight gain 05/11/2019  Annye Forrey, PT   Yehuda Printup 09/30/2020, 1:58 PM  [facility] [address]  [address] [place], [place], [address] Phone: [phone]   Fax:  [phone]  Name: Nancy Bautista MRN: [id number] Date of Birth: [birth year]

## 2020-09-30 NOTE — Patient Instructions (Signed)
Access Code: [email or web address] URL: [email or web address] Date: 09/30/2020 Prepared by: Reggy Eye  Exercises Prone Press Up - 3-4 x daily - 7 x weekly - 1-3 sets - 10 reps Supine Transversus Abdominis Bracing - Hands on Stomach - 3 x daily - 7 x weekly - 1 sets - 10 reps - 5 sec hold Hooklying Clamshell with Resistance - 1 x daily - 7 x weekly - 2 sets - 10 reps Supine Lower Trunk Rotation - 1 x daily - 7 x weekly - 1 sets - 10 reps - 3-5 seconds hold Clamshell - 1 x daily - 7 x weekly - 2 sets - 10 reps

## 2020-10-03 ENCOUNTER — Other Ambulatory Visit: Payer: Self-pay

## 2020-10-03 ENCOUNTER — Ambulatory Visit (INDEPENDENT_AMBULATORY_CARE_PROVIDER_SITE_OTHER): Payer: 59 | Admitting: Physical Therapy

## 2020-10-03 DIAGNOSIS — M6281 Muscle weakness (generalized): Secondary | ICD-10-CM | POA: Diagnosis not present

## 2020-10-03 DIAGNOSIS — M545 Low back pain, unspecified: Secondary | ICD-10-CM | POA: Diagnosis not present

## 2020-10-03 DIAGNOSIS — R29898 Other symptoms and signs involving the musculoskeletal system: Secondary | ICD-10-CM

## 2020-10-03 DIAGNOSIS — M25552 Pain in left hip: Secondary | ICD-10-CM | POA: Diagnosis not present

## 2020-10-03 NOTE — Therapy (Addendum)
[place] [address] [place], [place], [address] Phone: [phone]   Fax:  [phone]  Physical Therapy Treatment and Discharge  Patient Details  Name: Nancy Bautista MRN: [id number] Date of Birth: 20-Sep-1960 Referring Provider (PT): Nancy Bautista, [facility]   Encounter Date: 10/03/2020   PT End of Session - 10/03/20 1053     Visit Number 3    Number of Visits 12    Date for PT Re-Evaluation 11/08/20    Authorization Type aetna    PT Start Time 1950    PT Stop Time 1054    PT Time Calculation (min) 39 min    Activity Tolerance Patient tolerated treatment well    Behavior During Therapy [facility] for tasks assessed/performed             Past Medical History:  Diagnosis Date   GERD (gastroesophageal reflux disease)    Hypertension    Migraines     Past Surgical History:  Procedure Laterality Date   ABDOMINAL HYSTERECTOMY     ABDOMINAL SURGERY     BREAST ENHANCEMENT SURGERY     TONSILLECTOMY     VAGINAL HYSTERECTOMY      There were no vitals filed for this visit.   Subjective Assessment - 10/03/20 1025     Subjective Pt states she had a good day yesterday but pain is back today and going down her Rt LE with sitting in the car. Pt got home TENS unit and has been using it daily    Patient Stated Goals get rid of pain    Currently in Pain? Yes    Pain Score 6     Pain Location Back    Pain Orientation Lower    Pain Descriptors / Indicators Aching;Sharp    Pain Type Acute pain    Pain Onset More than a month ago                [facility] PT Assessment - 10/03/20 0001       Assessment   Medical Diagnosis SIJ dysfunction; acute LBP without sciatica    Referring Provider (PT) Nancy Bautista, [facility]    Onset Date/Surgical Date 07/13/20    Hand Dominance Right      AROM   Overall AROM Comments lumbar extension limited 75%                           [facility] Adult PT Treatment/Exercise - 10/03/20  0001       Lumbar Exercises: Stretches   Other Lumbar Stretch Exercise childs pose x 30 sec      Lumbar Exercises: Aerobic   Other Aerobic Exercise walking 3 laps around the gym for warm up with cues for posture      Lumbar Exercises: Supine   Clam 20 reps    Clam Limitations red TB    Bent Knee Raise 20 reps    Bent Knee Raise Limitations with ab set    Other Supine Lumbar Exercises lower trunk rotation      Lumbar Exercises: Sidelying   Clam Right;Left;15 reps      Lumbar Exercises: Prone   Other Prone Lumbar Exercises prone press up x 10      Lumbar Exercises: Quadruped   Madcat/Old Horse 5 reps    Straight Leg Raise 10 reps    Straight Leg Raises Limitations alt LE      Manual Therapy   Manual Therapy Soft  tissue mobilization    Joint Mobilization grade 2-3 CPAs and UPAs SIJ and L4/5    Soft tissue mobilization STM lumbar paraspinals, Rt glutes and piriformis    Passive ROM single knee to chest stretch and hamstring stretch bilat 2 x 30 sec                      PT Short Term Goals - 09/27/20 1012       PT SHORT TERM GOAL #1   Title STGs=LTGs               PT Long Term Goals - 09/27/20 1012       PT LONG TERM GOAL #1   Title Decrease pain in the back by >=80% allowing patient to walk and participate in normal funcitonal activities with less pain and greater ease    Status New    Target Date 11/08/20      PT LONG TERM GOAL #2   Title Improve tissue extensibility and ROM through lumbar and hips to allow greater ease of movement and decreased pain    Time 6    Period Weeks    Status New      PT LONG TERM GOAL #3   Title Able to climb stairs without pain    Time 6    Period Weeks    Status New      PT LONG TERM GOAL #4   Title Able to perform sit to stand and bed mobility with self-reported minimal pain in the back.    Time 6    Period Weeks    Status New      PT LONG TERM GOAL #5   Title Improve FS score to 40    Baseline 10     Time 6    Period Weeks    Status New      Additional Long Term Goals   Additional Long Term Goals Yes      PT LONG TERM GOAL #6   Title Able to demonstrate correct body mechanics with squat, lift and carries of at least 10# and be independent in advanced HEP to prevent futher injury    Time 6    Period Weeks    Status New                   Plan - 10/03/20 1055     Clinical Impression Statement Pt able to tolerate quadruped exercises this session with no increase in pain. Pt continiues with significant pain with lumbar extension. Pt with c/o Rt hip pain this session when laying on Rt side. Pt to see MD tomorrow to determine next steps    PT Next Visit Plan core stabilization, stretching as tolerated, update HEP    PT Home Exercise Plan [id number]    Consulted and Agree with Plan of Care Patient             Patient will benefit from skilled therapeutic intervention in order to improve the following deficits and impairments:     Visit Diagnosis: Acute bilateral low back pain, unspecified whether sciatica present  Other symptoms and signs involving the musculoskeletal system  Muscle weakness (generalized)  Pain in left hip     Problem List Patient Active Problem List   Diagnosis Date Noted   Primary osteoarthritis of left hip 09/14/2019   Palpitations 05/11/2019   Essential hypertension 05/11/2019   Heart murmur, systolic 42/70/6237   Urinary incontinence 05/11/2019  Weight gain 05/11/2019  PHYSICAL THERAPY DISCHARGE SUMMARY  Visits from Start of Care: 3  Current functional level related to goals / functional outcomes: Improved exercise tolerance   Remaining deficits: See above   Education / Equipment: HEP   Patient agrees to discharge. Patient goals were not met. Patient is being discharged due to not returning since the last visit. Nancy Bautista, PT,DPT09/28/2210:00 AM  Nancy Bautista, PT  Nancy Bautista 10/03/2020, 10:58 AM  [facility] [place], [place], [address] Phone: [phone]   Fax:  [phone]  Name: Nancy Bautista MRN: [id number] Date of Birth: 30-Jan-1961

## 2020-10-05 ENCOUNTER — Other Ambulatory Visit: Payer: Self-pay

## 2020-10-05 ENCOUNTER — Ambulatory Visit: Payer: Self-pay

## 2020-10-05 ENCOUNTER — Ambulatory Visit (INDEPENDENT_AMBULATORY_CARE_PROVIDER_SITE_OTHER): Payer: 59 | Admitting: Cardiology

## 2020-10-05 ENCOUNTER — Ambulatory Visit (INDEPENDENT_AMBULATORY_CARE_PROVIDER_SITE_OTHER): Payer: 59 | Admitting: Sports Medicine

## 2020-10-05 ENCOUNTER — Encounter: Payer: Self-pay | Admitting: Cardiology

## 2020-10-05 VITALS — BP 132/89 | HR 61 | Ht 67.0 in | Wt 154.4 lb

## 2020-10-05 DIAGNOSIS — I10 Essential (primary) hypertension: Secondary | ICD-10-CM

## 2020-10-05 DIAGNOSIS — M545 Low back pain, unspecified: Secondary | ICD-10-CM | POA: Diagnosis not present

## 2020-10-05 DIAGNOSIS — R002 Palpitations: Secondary | ICD-10-CM | POA: Diagnosis not present

## 2020-10-05 NOTE — Patient Instructions (Signed)

## 2020-10-05 NOTE — Progress Notes (Signed)
    Procedures performed today:    Procedure: Real-time Ultrasound Guided injection of the right sacroiliac joint Device: Samsung HS60  Verbal informed consent obtained.  Time-out conducted.  Noted no overlying erythema, induration, or other signs of local infection.  Skin prepped in a sterile fashion.  Local anesthesia: Topical Ethyl chloride.  With sterile technique and under real time ultrasound guidance: Noted normal-appearing SI joint, 1 cc Kenalog 40, 2 cc lidocaine, 2 cc bupivacaine injected easily Completed without difficulty  Advised to call if fevers/chills, erythema, induration, drainage, or persistent bleeding.  Images permanently stored and available for review in PACS.  Impression: Technically successful ultrasound guided injection.  Independent interpretation of notes and tests performed by another provider:   None.  Brief History, Exam, Impression, and Recommendations:    Acute right-sided low back pain without sciatica This is a pleasant 60 year old female, she has had 8 weeks of pain in her low back, right-sided with radiation into the buttock and thigh but not past the knee, she was seen by Ander Slade, she did get some Flexeril, oxycodone, nothing is improving her pain. She had several chiropractic manipulation, acupuncture, none of which were efficacious. No red flag signs, on exam she has tenderness at the right sacroiliac joint. Lumbar spine MRI was normal with the exception of mild facet arthritis. Today we did a right sacroiliac joint injection for diagnostic and therapeutic purposes, Nanette did report concordant pain during the procedure and dramatic relief approximately 1 minute afterwards, return to see me in 1 month.    ___________________________________________ [name]. [name], M.D., ABFM., CAQSM. Primary Care and Sports Medicine [facility]  Adjunct Instructor of Family Medicine  [employer]

## 2020-10-05 NOTE — Assessment & Plan Note (Addendum)
This is a pleasant 60 year old female, she has had 8 weeks of pain in her low back, right-sided with radiation into the buttock and thigh but not past the knee, she was seen by Ander Slade, she did get some Flexeril, oxycodone, nothing is improving her pain. She had several chiropractic manipulation, acupuncture, none of which were efficacious. No red flag signs, on exam she has tenderness at the right sacroiliac joint. Lumbar spine MRI was normal with the exception of mild facet arthritis. Today we did a right sacroiliac joint injection for diagnostic and therapeutic purposes, Nancy Bautista did report concordant pain during the procedure and dramatic relief approximately 1 minute afterwards, return to see me in 1 month.

## 2020-10-06 ENCOUNTER — Encounter: Payer: 59 | Admitting: Rehabilitative and Restorative Service Providers"

## 2020-11-02 ENCOUNTER — Ambulatory Visit: Payer: 59 | Admitting: Sports Medicine

## 2020-11-09 ENCOUNTER — Other Ambulatory Visit: Payer: Self-pay

## 2020-11-09 ENCOUNTER — Ambulatory Visit (INDEPENDENT_AMBULATORY_CARE_PROVIDER_SITE_OTHER): Payer: 59 | Admitting: Sports Medicine

## 2020-11-09 DIAGNOSIS — M545 Low back pain, unspecified: Secondary | ICD-10-CM | POA: Diagnosis not present

## 2020-11-09 MED ORDER — CELECOXIB 200 MG PO CAPS: ORAL_CAPSULE | ORAL | 2 refills | Status: DC

## 2020-11-09 NOTE — Assessment & Plan Note (Signed)
Nancy Bautista is a pleasant 60 year old female, chronic low back pain, right-sided with radiation to the buttock and thigh, ultimately an MRI showed normal discs, mild L4-L5 facet arthropathy, clinically she had pain at the right sacroiliac joint, at the last visit I injected her right sacroiliac joint for diagnostic and therapeutic purposes, she had concordant pain during the procedure and dramatic relief 1 minute afterwards, this dramatic relief lasted about a week at which point her pain returned. She does endorse pain closer to the midline today. As the only other visible pain generator was her L4-L5 facets I would like her to see Dr. Laurian Brim for facet joint injections, we can compare relief, and if she notes better relief with the facet joints we would proceed with radiofrequency ablation, if better relief from her SI joint injection that we did last month I would set her up with Dr. Laurian Brim again for [facility] joint radiofrequency ablation. Adding some Celebrex in the meantime.

## 2020-11-09 NOTE — Progress Notes (Signed)
    Procedures performed today:    None.  Independent interpretation of notes and tests performed by another provider:   None.  Brief History, Exam, Impression, and Recommendations:    Acute right-sided low back pain without sciatica Nancy Bautista is a pleasant 60 year old female, chronic low back pain, right-sided with radiation to the buttock and thigh, ultimately an MRI showed normal discs, mild L4-L5 facet arthropathy, clinically she had pain at the right sacroiliac joint, at the last visit I injected her right sacroiliac joint for diagnostic and therapeutic purposes, she had concordant pain during the procedure and dramatic relief 1 minute afterwards, this dramatic relief lasted about a week at which point her pain returned. She does endorse pain closer to the midline today. As the only other visible pain generator was her L4-L5 facets I would like her to see Dr. Laurian Bautista for facet joint injections, we can compare relief, and if she notes better relief with the facet joints we would proceed with radiofrequency ablation, if better relief from her SI joint injection that we did last month I would set her up with Dr. Laurian Bautista again for [facility] joint radiofrequency ablation. Adding some Celebrex in the meantime.    ___________________________________________ [name]. [name], M.D., ABFM., CAQSM. Primary Care and Sports Medicine [facility]  Adjunct Instructor of Family Medicine  [employer]

## 2020-11-15 NOTE — Telephone Encounter (Signed)
I re faxed referral to Scarlette Calico as patient requested - CF

## 2020-11-30 ENCOUNTER — Ambulatory Visit: Payer: 59 | Admitting: Sports Medicine

## 2020-12-20 ENCOUNTER — Other Ambulatory Visit: Payer: Self-pay | Admitting: Medical-Surgical

## 2020-12-20 ENCOUNTER — Encounter: Payer: Self-pay | Admitting: Medical-Surgical

## 2021-01-02 ENCOUNTER — Ambulatory Visit (INDEPENDENT_AMBULATORY_CARE_PROVIDER_SITE_OTHER): Payer: 59 | Admitting: Medical-Surgical

## 2021-01-02 ENCOUNTER — Other Ambulatory Visit: Payer: Self-pay

## 2021-01-02 ENCOUNTER — Encounter: Payer: Self-pay | Admitting: Medical-Surgical

## 2021-01-02 VITALS — BP 138/88 | HR 76 | Resp 20 | Ht 67.0 in | Wt 156.4 lb

## 2021-01-02 DIAGNOSIS — I10 Essential (primary) hypertension: Secondary | ICD-10-CM | POA: Diagnosis not present

## 2021-01-02 DIAGNOSIS — Z23 Encounter for immunization: Secondary | ICD-10-CM | POA: Diagnosis not present

## 2021-01-02 DIAGNOSIS — K219 Gastro-esophageal reflux disease without esophagitis: Secondary | ICD-10-CM | POA: Diagnosis not present

## 2021-01-02 DIAGNOSIS — R635 Abnormal weight gain: Secondary | ICD-10-CM | POA: Diagnosis not present

## 2021-01-02 DIAGNOSIS — F418 Other specified anxiety disorders: Secondary | ICD-10-CM

## 2021-01-02 DIAGNOSIS — R002 Palpitations: Secondary | ICD-10-CM | POA: Diagnosis not present

## 2021-01-02 MED ORDER — METOPROLOL TARTRATE 25 MG PO TABS: 12.5000 mg | ORAL_TABLET | Freq: Two times a day (BID) | ORAL | 2 refills | Status: DC | PRN

## 2021-01-02 MED ORDER — OMEPRAZOLE 40 MG PO CPDR: 40.0000 mg | DELAYED_RELEASE_CAPSULE | Freq: Every day | ORAL | 3 refills | Status: AC

## 2021-01-02 MED ORDER — BUPROPION HCL ER (XL) 150 MG PO TB24: ORAL_TABLET | ORAL | 1 refills | Status: AC

## 2021-01-02 MED ORDER — LOSARTAN POTASSIUM 25 MG PO TABS
25.0000 mg | ORAL_TABLET | Freq: Every day | ORAL | 1 refills | Status: DC
Start: 2021-01-02 — End: 2021-07-31

## 2021-01-02 NOTE — Progress Notes (Signed)
  HPI with pertinent ROS:   CC: Medication refills, [place]  HPI: Pleasant 60 year old female presenting today for medication refills as she will be moving out of [place] soon.  Hypertension-taking losartan 25 mg daily, tolerating well without side effects.  Checking blood pressure at home with readings similar to our office today.  Some regular activity noted.  Follows a low-sodium diet. Denies CP, SOB, palpitations, lower extremity edema, dizziness, headaches, or vision changes.   Palpitations-was followed by cardiology and saw them a few months ago.  Notes that she uses metoprolol 12.5 mg twice daily as needed but usually only takes this once a day at the most.  Notes that there are times when she does not take that she may get worried because she did not which possibly prompts the feeling of palpitations.  Weight maintenance-taking Wellbutrin 150 mg daily, tolerating well without side effects.  Feels this continues to do well for her and her weight is holding steady.  GERD-taking Prilosec 40 mg daily, tolerating well without side effects.  Feels the medication is still working well for her and helps control her reflux symptoms.   I reviewed the past medical history, family history, social history, surgical history, and allergies today and no changes were needed.  Please see the problem list section below in epic for further details.   Physical exam:   General: Well Developed, well nourished, and in no acute distress.  Neuro: Alert and oriented x3.  HEENT: Normocephalic, atraumatic.  Skin: Warm and dry. Cardiac: Regular rate and rhythm, no murmurs rubs or gallops, no lower extremity edema.  Respiratory: Clear to auscultation bilaterally. Not using accessory muscles, speaking in full sentences.  Impression and Recommendations:    1. Essential hypertension Blood pressure at goal today.  Continue losartan 25 mg daily as prescribed.  Refills sent to pharmacy. - losartan  (COZAAR) 25 MG tablet; Take 1 tablet (25 mg total) by mouth daily.  Dispense: 90 tablet; Refill: 1  2. Weight gain Continue Wellbutrin 150 mg daily.  Refill sent to pharmacy. - buPROPion (WELLBUTRIN XL) 150 MG 24 hr tablet; TAKE 1 TABLET BY MOUTH EVERY DAY IN THE MORNING  Dispense: 90 tablet; Refill: 1  3. Palpitations Managed by cardiology.  Continue Lopressor twice daily as needed.  4. Gastroesophageal reflux disease without esophagitis Continue Prilosec 40 mg daily as needed.  5. Flu vaccine need Flu vaccine given in office today. - Flu Vaccine QUAD 33mo+IM (Fluarix, Fluzone & Alfiuria Quad PF)  6. Depression with anxiety Continue Wellbutrin as prescribed. - buPROPion (WELLBUTRIN XL) 150 MG 24 hr tablet; TAKE 1 TABLET BY MOUTH EVERY DAY IN THE MORNING  Dispense: 90 tablet; Refill: 1  Return if symptoms worsen or fail to improve.  Although she is relocating, advised patient that should we are able to take care of some of her primary care needs until she is able to establish a new provider.  Patient verbalized understanding and is agreeable to the plan. ___________________________________________ Thayer Ohm, DNP, APRN, FNP-BC Primary Care and Sports Medicine [facility]

## 2021-02-02 ENCOUNTER — Ambulatory Visit: Payer: 59 | Admitting: Medical-Surgical

## 2021-03-31 ENCOUNTER — Other Ambulatory Visit: Payer: Self-pay | Admitting: Medical-Surgical

## 2021-07-30 ENCOUNTER — Other Ambulatory Visit: Payer: Self-pay | Admitting: Medical-Surgical

## 2021-07-30 DIAGNOSIS — I10 Essential (primary) hypertension: Secondary | ICD-10-CM

## 2021-07-31 NOTE — Telephone Encounter (Signed)
LVM for patient to call back to schedule appt with PCP. AMUCK 

## 2021-07-31 NOTE — Telephone Encounter (Signed)
Patient needs a follow up appointment for further refills.  Patient last seen Christen Butter 08/03/2020 and canceled follow up appointment for 02/02/2021.  Thank you,  Okey Regal, CMA

## 2021-08-29 ENCOUNTER — Other Ambulatory Visit: Payer: Self-pay | Admitting: Medical-Surgical

## 2021-08-29 DIAGNOSIS — I10 Essential (primary) hypertension: Secondary | ICD-10-CM

## 2021-09-08 ENCOUNTER — Other Ambulatory Visit: Payer: Self-pay | Admitting: Medical-Surgical

## 2021-09-08 DIAGNOSIS — I10 Essential (primary) hypertension: Secondary | ICD-10-CM

## 2022-01-15 ENCOUNTER — Other Ambulatory Visit: Payer: Self-pay | Admitting: Medical-Surgical

## 2022-01-16 NOTE — Telephone Encounter (Signed)
Left message for patient to call back to schedule a appointment with Christen Butter for further refills on Omeprazole 40 MG capsule. tvt

## 2022-01-16 NOTE — Telephone Encounter (Signed)
Patient needs an appointment for further refills on Omeprazole 40 MG capsule.  Last office visit 01/02/2021  Last filled 01/02/2021  Prescription refused

## 2022-10-13 IMAGING — DX DG CHEST 2V
2 series · 2 of 2 positions shown · non-contrast
Comparison: None.

CLINICAL DATA: Cough.  Shortness of breath.  Recent PIDZ3-1F.

EXAM:
CHEST - 2 VIEW

[chest pa]
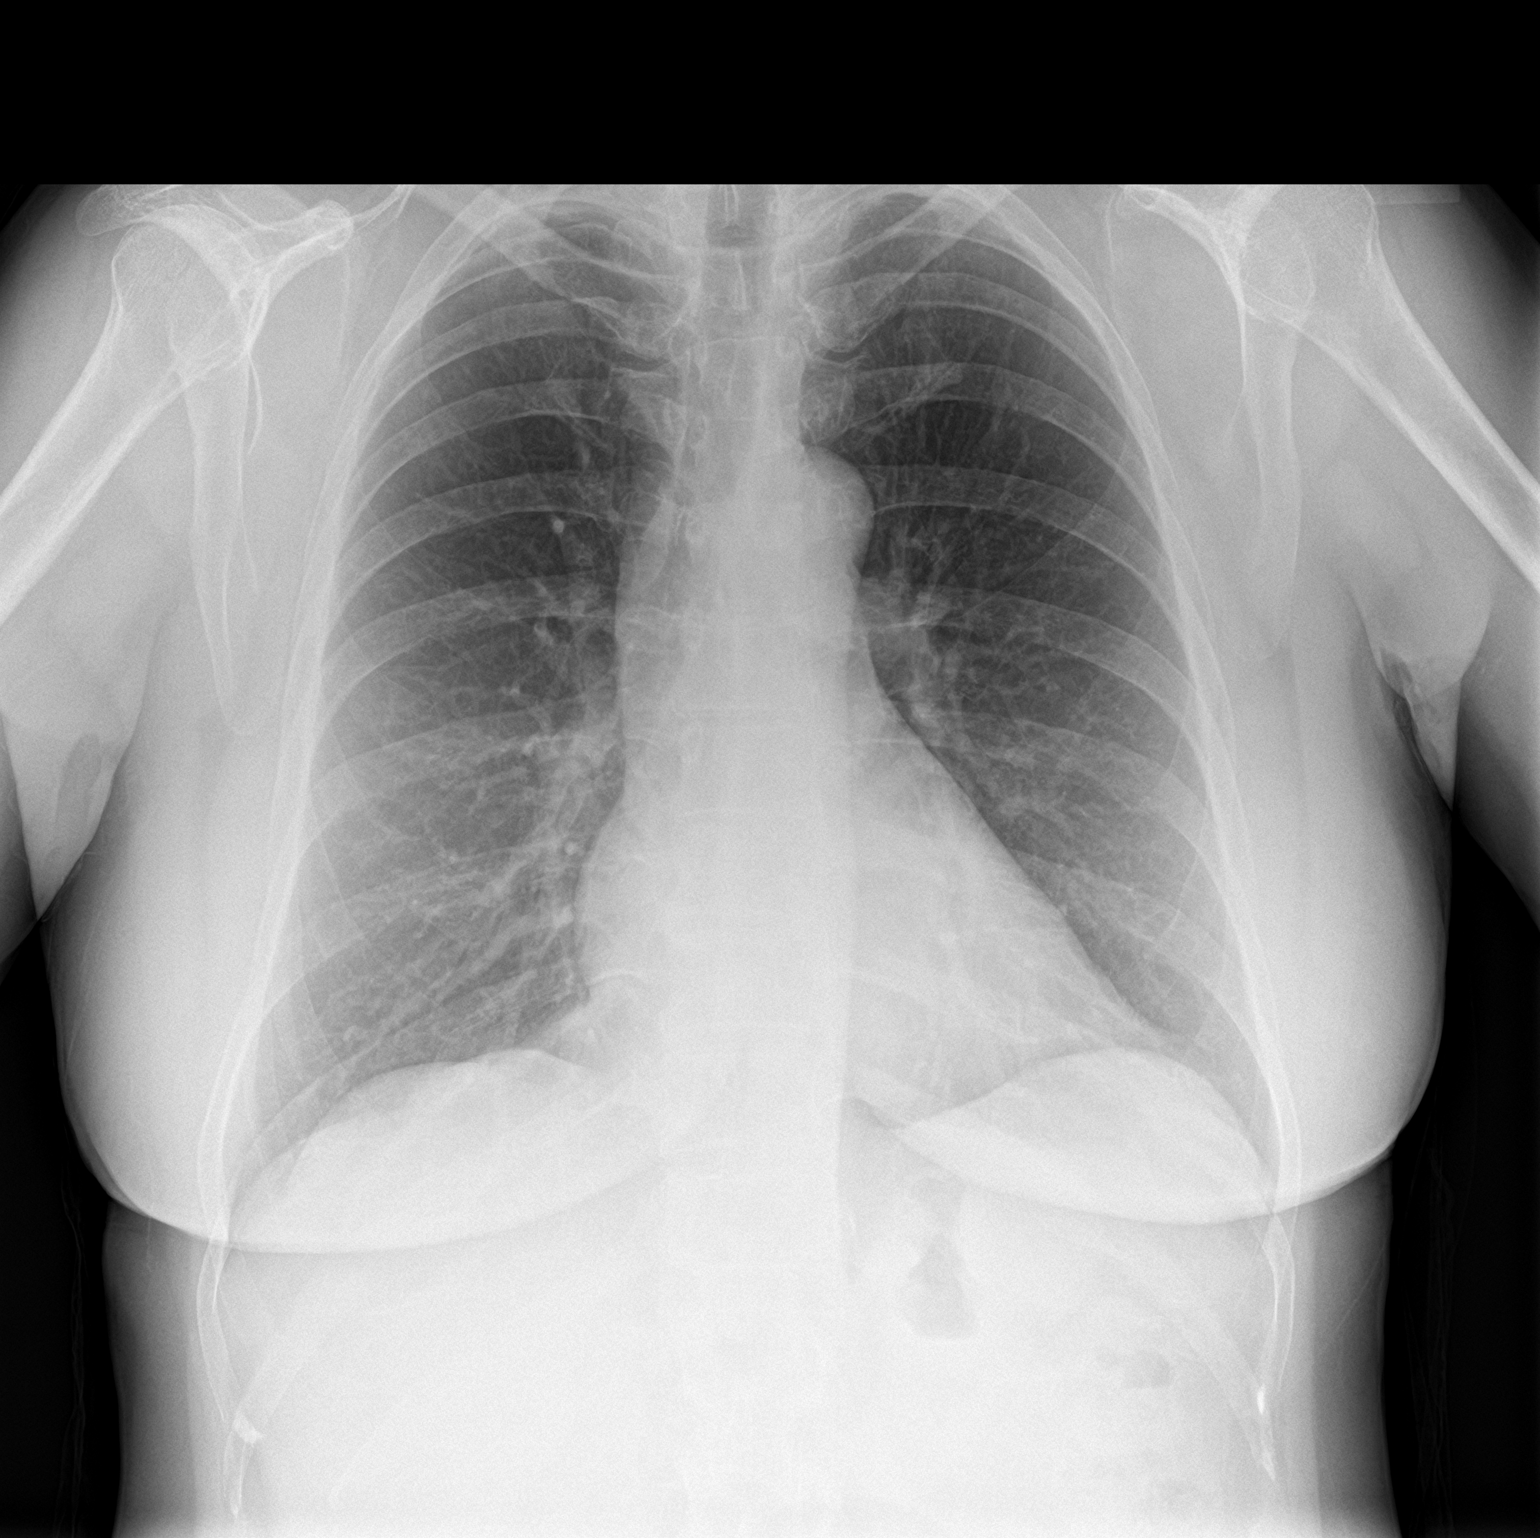

[chest lat]
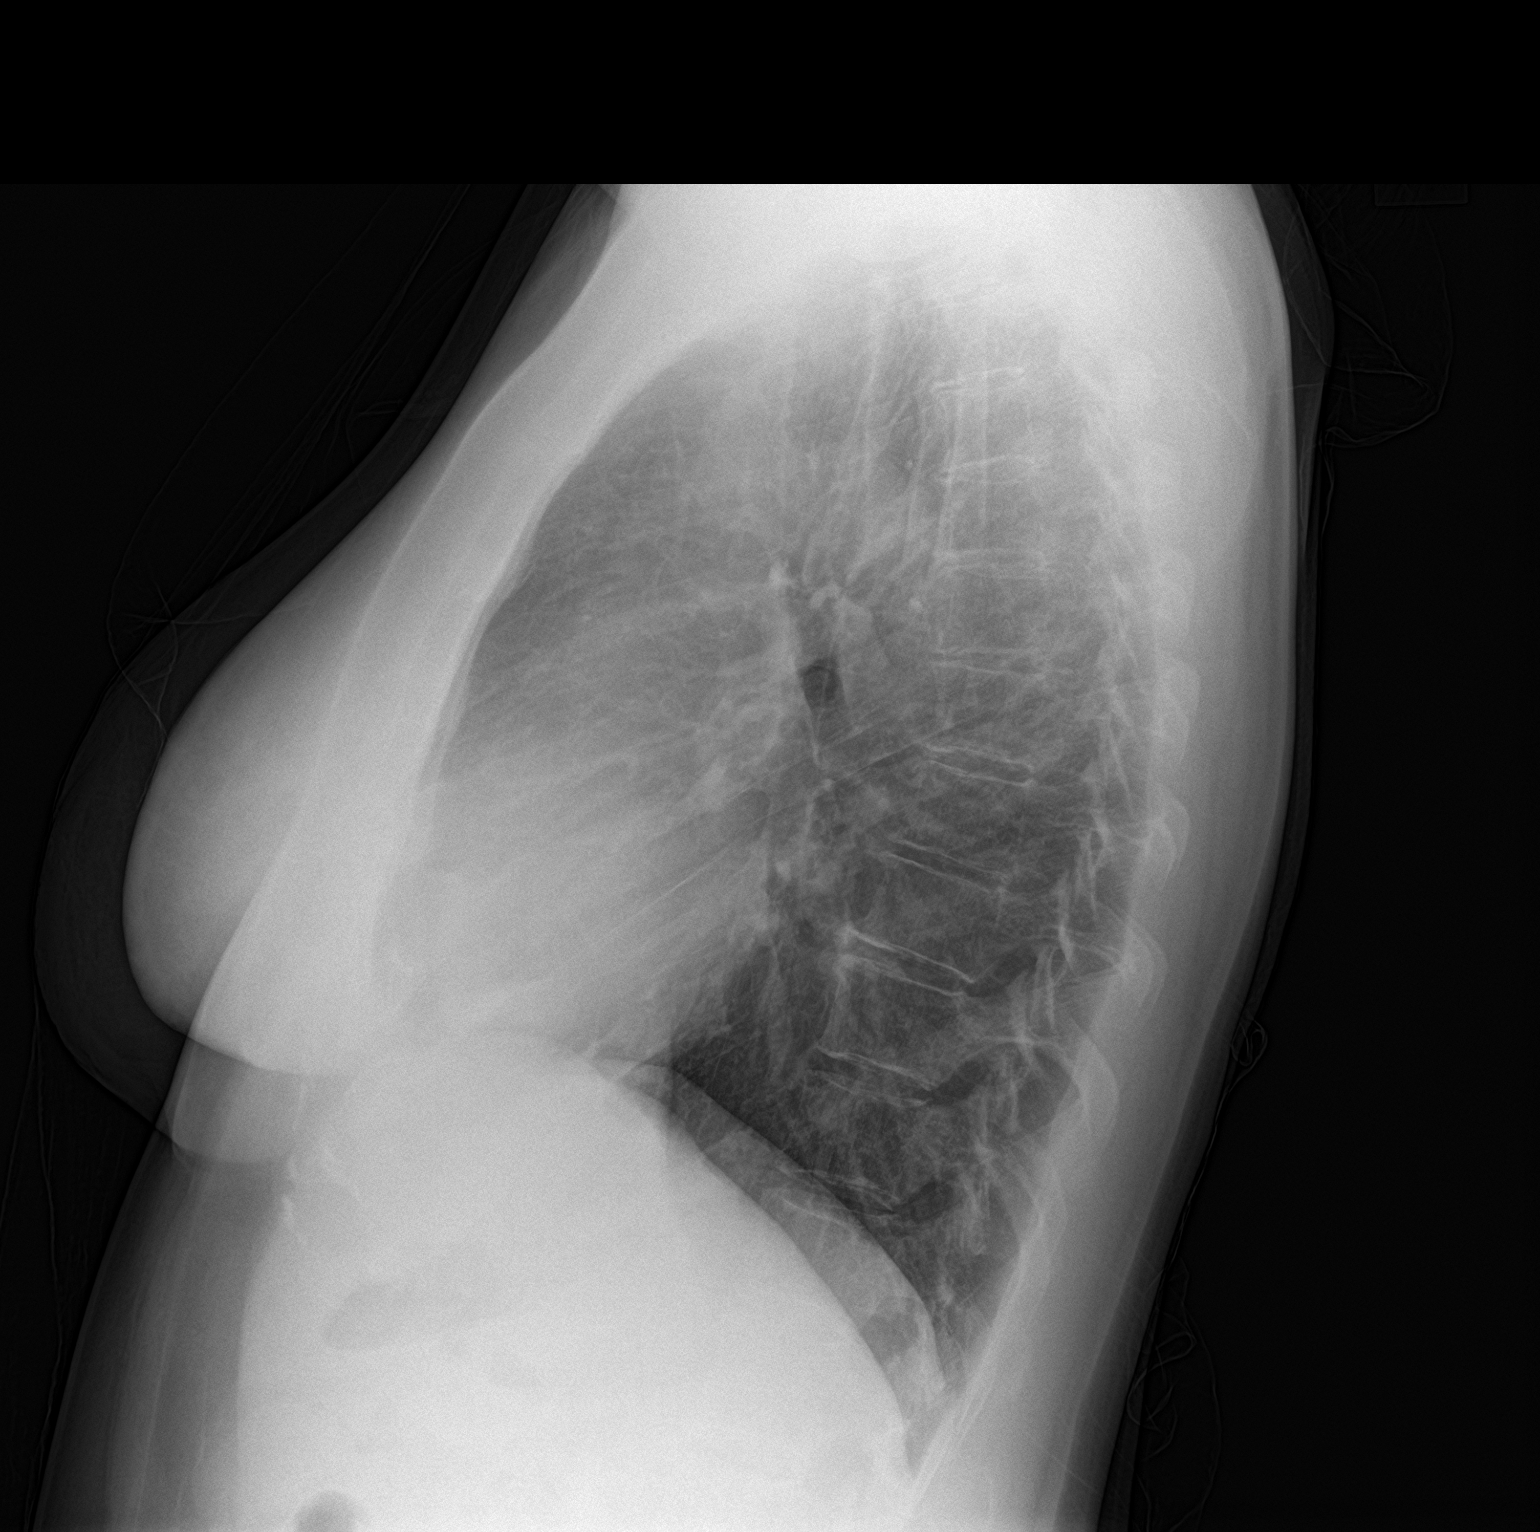

[2 of 2 positions shown; findings below may reference images not displayed]

FINDINGS: The heart, hila, and mediastinum are normal. No pneumothorax. No
nodules or masses. No focal infiltrates. No other acute
abnormalities.
IMPRESSION: No active cardiopulmonary disease.

## 2023-10-15 ENCOUNTER — Encounter: Payer: Self-pay | Admitting: Sports Medicine
